# Patient Record
Sex: Male | Born: 1980
Health system: Southern US, Community
[De-identification: ages and names within clinical notes are randomized; demographics above are authoritative.]

## PROBLEM LIST (undated history)

## (undated) DIAGNOSIS — G43909 Migraine, unspecified, not intractable, without status migrainosus: Secondary | ICD-10-CM

## (undated) HISTORY — DX: Migraine, unspecified, not intractable, without status migrainosus: G43.909

---

## 2019-10-11 ENCOUNTER — Emergency Department (HOSPITAL_COMMUNITY)
Admission: EM | Admit: 2019-10-11 | Discharge: 2019-10-11 | Disposition: A | Discharge status: Home / Self Care | Payer: Worker's Compensation | Attending: Emergency Medicine | Admitting: Emergency Medicine

## 2019-10-11 ENCOUNTER — Emergency Department (HOSPITAL_COMMUNITY): Payer: Worker's Compensation

## 2019-10-11 ENCOUNTER — Other Ambulatory Visit: Payer: Self-pay

## 2019-10-11 ENCOUNTER — Encounter (HOSPITAL_COMMUNITY): Payer: Self-pay | Admitting: Emergency Medicine

## 2019-10-11 DIAGNOSIS — Y999 Unspecified external cause status: Secondary | ICD-10-CM | POA: Insufficient documentation

## 2019-10-11 DIAGNOSIS — S81812A Laceration without foreign body, left lower leg, initial encounter: Secondary | ICD-10-CM | POA: Diagnosis not present

## 2019-10-11 DIAGNOSIS — Y929 Unspecified place or not applicable: Secondary | ICD-10-CM | POA: Diagnosis not present

## 2019-10-11 DIAGNOSIS — Z23 Encounter for immunization: Secondary | ICD-10-CM | POA: Diagnosis not present

## 2019-10-11 DIAGNOSIS — W293XXA Contact with powered garden and outdoor hand tools and machinery, initial encounter: Secondary | ICD-10-CM | POA: Diagnosis not present

## 2019-10-11 DIAGNOSIS — Y93H2 Activity, gardening and landscaping: Secondary | ICD-10-CM | POA: Insufficient documentation

## 2019-10-11 MED ORDER — TETANUS-DIPHTH-ACELL PERTUSSIS 5-2.5-18.5 LF-MCG/0.5 IM SUSP
0.5000 mL | Freq: Once | INTRAMUSCULAR | Status: AC
  Administered 2019-10-11: 0.5 mL via INTRAMUSCULAR
  Filled 2019-10-11: qty 0.5

## 2019-10-11 MED ORDER — LIDOCAINE-EPINEPHRINE (PF) 2 %-1:200000 IJ SOLN
20.0000 mL | Freq: Once | INTRAMUSCULAR | Status: AC
  Administered 2019-10-11: 20 mL
  Filled 2019-10-11: qty 20

## 2019-10-11 MED ORDER — SULFAMETHOXAZOLE-TRIMETHOPRIM 800-160 MG PO TABS: 1.0000 | ORAL_TABLET | Freq: Two times a day (BID) | ORAL | 0 refills | Status: AC

## 2019-10-11 NOTE — ED Notes (Signed)
Pt dc'd home w/all belongings, a/o x4, driven home by spouse  

## 2019-10-11 NOTE — ED Triage Notes (Signed)
Pt arrives to ED from work with complaints of accidentally cutting above his left knee with a chainsaw. Patient has about a 6cm laceration but bleeding is currently controlled. Patient has feeling above and below injury.

## 2019-10-11 NOTE — Progress Notes (Signed)
Orthopedic Tech Progress Note Patient Details:  Jauan Wohl 26-Nov-1980 [id number]  Ortho Devices Type of Ortho Device: Knee Immobilizer Ortho Device/Splint Location: LLE Ortho Device/Splint Interventions: Ordered, Application, Adjustment   Post Interventions Patient Tolerated: Well, Ambulated well Instructions Provided: Care of device, Adjustment of device, Poper ambulation with device   Donald Pore 10/11/2019, 4:05 PM

## 2019-10-11 NOTE — Discharge Instructions (Signed)
1. Medications: Tylenol or ibuprofen for pain, antibiotics 2. Treatment: ice for swelling, keep wound clean with warm soap and water and keep bandage dry. Wear knee immobilizer when walking to prevent tearing the stitches  3. Follow Up: Please return in 10 days to have your stitches removed or sooner if you have concerns. Return to the emergency department for increased redness, drainage of pus from the wound   WOUND CARE  Remove bandage and wash wound gently with mild soap and warm water. Reapply a new bandage after cleaning wound   Continue daily cleansing with soap and water until stitches are removed.  Do not apply any ointments or creams to the wound while stitches are in place, as this may cause delayed healing. Return if you experience any of the following signs of infection: Swelling, redness, pus drainage, streaking, fever >101.0 F  Return if you experience excessive bleeding that does not stop after 15-20 minutes of constant, firm pressure.

## 2019-10-11 NOTE — ED Provider Notes (Addendum)
[facility] EMERGENCY DEPARTMENT Provider Note   CSN: [id number] Arrival date & time: 10/11/19  1239     History Chief Complaint  Patient presents with  . Extremity Laceration    Jerome Young is a 39 y.o. male presenting for evaluation of leg laceration.   Patient states his prior to arrival he was cutting grass with a chainsaw when it bounced back and hit his left leg.  He reports acute onset bleeding, but no significant pain.  He has been able to ambulate on it without difficulty.  Denies numbness, tingling, weakness.  He is not on blood thinners.  He is not on his last tetanus shot was, maybe about 5 years ago.  He has no other medical problems, takes medications daily.  He denies injury elsewhere.  HPI     History reviewed. No pertinent past medical history.  There are no problems to display for this patient.    History reviewed. No pertinent family history.  Social History   Tobacco Use  . Smoking status: Not on file  Substance Use Topics  . Alcohol use: Not on file  . Drug use: Not on file    Home Medications Prior to Admission medications   Medication Sig Start Date End Date Taking? Authorizing Provider  sulfamethoxazole-trimethoprim (BACTRIM DS) 800-160 MG tablet Take 1 tablet by mouth 2 (two) times daily for 7 days. 10/11/19 10/18/19  Zabdi Mis, PA-C    Allergies    Penicillins  Review of Systems   Review of Systems  Skin: Positive for wound.  Hematological: Does not bruise/bleed easily.  All other systems reviewed and are negative.   Physical Exam Updated Vital Signs BP (!) 131/93 (BP Location: Right Arm)   Pulse 89   Temp 98.6 F (37 C) (Oral)   Resp 16   Wt 95.3 kg   SpO2 98%   Physical Exam Vitals and nursing note reviewed.  Constitutional:      General: He is not in acute distress.    Appearance: He is well-developed.     Comments: Sitting comfortably in the bed in no acute distress  HENT:     Head:  Normocephalic and atraumatic.  Cardiovascular:     Rate and Rhythm: Normal rate.  Pulmonary:     Effort: Respiratory distress present.  Abdominal:     General: There is no distension.  Musculoskeletal:     Cervical back: Normal range of motion and neck supple.     Comments: Large, approximately 6 cm laceration just proximal to the knee on the anterior thigh. ~ 1.5 cm deep. No visible muscle involvement.  No difficulty with straight leg raise.  Patient able to flex and extend at the knee against resistance without difficulty.  Pedal pulses 2+ bilaterally.  Good distal cap refill.  Skin:    General: Skin is warm and dry.     Capillary Refill: Capillary refill takes less than 2 seconds.  Neurological:     Mental Status: He is alert and oriented to person, place, and time.     ED Results / Procedures / Treatments   Labs (all labs ordered are listed, but only abnormal results are displayed) Labs Reviewed - No data to display  EKG None  Radiology DG Knee Complete 4 Views Left  Result Date: 10/11/2019 CLINICAL DATA:  Chainsaw laceration anterior left femur EXAM: LEFT KNEE - COMPLETE 4+ VIEW COMPARISON:  None. FINDINGS: Frontal, bilateral oblique, lateral views of the left knee demonstrate superficial laceration  suprapatellar region, best seen on lateral view. No fractures or radiopaque foreign bodies. Joint spaces are well preserved. No joint effusion. IMPRESSION: 1. Laceration left suprapatellar region. No fracture or radiopaque foreign body. Electronically Signed   By: Sharlet Salina M.D.   On: 10/11/2019 14:19    Procedures .Marland KitchenLaceration Repair  Date/Time: 10/11/2019 3:44 PM Performed by: Alveria Apley, PA-C Authorized by: Alveria Apley, PA-C   Consent:    Consent obtained:  Verbal   Consent given by:  Patient   Risks discussed:  Infection, nerve damage, need for additional repair, pain, poor cosmetic result, poor wound healing, vascular damage, tendon damage and retained  foreign body Anesthesia (see MAR for exact dosages):    Anesthesia method:  Local infiltration   Local anesthetic:  Lidocaine 2% WITH epi Laceration details:    Location:  Leg   Leg location:  L upper leg   Length (cm):  6   Depth (mm):  15 Repair type:    Repair type:  Intermediate Pre-procedure details:    Preparation:  Patient was prepped and draped in usual sterile fashion and imaging obtained to evaluate for foreign bodies Exploration:    Hemostasis achieved with:  Direct pressure   Wound exploration: wound explored through full range of motion and entire depth of wound probed and visualized     Wound extent: no muscle damage noted, no nerve damage noted, no tendon damage noted, no underlying fracture noted and no vascular damage noted     Contaminated: yes   Treatment:    Area cleansed with:  Saline   Irrigation solution:  Sterile saline   Irrigation volume:  3000   Irrigation method:  Syringe   Visualized foreign bodies/material removed: yes   Subcutaneous repair:    Suture size:  4-0   Wound subcutaneous closure material used: vicryl rapide.   Suture technique:  Simple interrupted   Number of sutures:  3 Skin repair:    Repair method:  Sutures   Suture size:  4-0   Suture material:  Prolene   Suture technique:  Horizontal mattress and simple interrupted   Number of sutures: 7 horizonal mattress, 1 simple interruptec. Approximation:    Approximation:  Close Post-procedure details:    Dressing:  Sterile dressing and splint for protection   Patient tolerance of procedure:  Tolerated well, no immediate complications   (including critical care time)  Medications Ordered in ED Medications  Tdap (BOOSTRIX) injection 0.5 mL (0.5 mLs Intramuscular Given 10/11/19 1424)  lidocaine-EPINEPHrine (XYLOCAINE W/EPI) 2 %-1:200000 (PF) injection 20 mL (20 mLs Infiltration Given by Other 10/11/19 1424)    ED Course  I have reviewed the triage vital signs and the nursing  notes.  Pertinent labs & imaging results that were available during my care of the patient were reviewed by me and considered in my medical decision making (see chart for details).    MDM Rules/Calculators/A&P                      Patient presenting for evaluation of lip laceration.  Physical examination, he was neurovascularly intact.  Full active range of motion without difficulty.  Wound is extremely dirty, with bits of debris.  Irrigated extensively.  X-ray viewed interpreted by me, no fracture or bony injury.  Laceration repaired as described above.  Will place patient on antibiotics due to wound being contaminated.  Will place patient in knee immobilizer for protection until stitches are removed.  Discussed aftercare instructions.  At this time, patient appears safe for d/c. Return precautions given. Pt states he understands and agrees to plan.   Final Clinical Impression(s) / ED Diagnoses Final diagnoses:  Laceration of left lower extremity, initial encounter    Rx / DC Orders ED Discharge Orders         Ordered    sulfamethoxazole-trimethoprim (BACTRIM DS) 800-160 MG tablet  2 times daily     10/11/19 1530           [place], [place] Noori, PA-C 10/11/19 1547    Tegeler, Gwenyth Allegra, MD 10/11/19 1631    Franchot Heidelberg, PA-C 10/11/19 1653    Tegeler, Gwenyth Allegra, MD 10/11/19 1654

## 2019-10-22 ENCOUNTER — Other Ambulatory Visit: Payer: Self-pay

## 2019-10-22 ENCOUNTER — Ambulatory Visit (HOSPITAL_COMMUNITY)
Admission: EM | Admit: 2019-10-22 | Discharge: 2019-10-22 | Disposition: A | Discharge status: Home / Self Care | Payer: 59 | Attending: Family Medicine | Admitting: Family Medicine

## 2019-10-22 DIAGNOSIS — Z4802 Encounter for removal of sutures: Secondary | ICD-10-CM

## 2019-10-22 DIAGNOSIS — Z5189 Encounter for other specified aftercare: Secondary | ICD-10-CM

## 2019-10-22 NOTE — ED Notes (Signed)
5 horizontal mattress sutures and 1 simple stitch removed.  Dr. Tracie Harrier assisted on one suture and he assessed the wound.  Pt tolerated it well.

## 2019-10-24 NOTE — ED Provider Notes (Signed)
  [facility]   [id number] 10/22/19 Arrival Time: 1152  ASSESSMENT & PLAN:  1. Visit for wound check   2. Visit for suture removal     Assisted RN with suture removal. No complications. Wound is healing.  Reviewed expectations re: course of current medical issues. Questions answered. Outlined signs and symptoms indicating need for more acute intervention. Patient verbalized understanding. After Visit Summary given.   Jerome Young is a 39 y.o. male who presents with a laceration of his LLE. Here for suture removal. Reports no complications. Wound of LLE without signs of infection.    Allergies  Allergen Reactions  . Penicillins      Social History   Socioeconomic History  . Marital status: Married    Spouse name: Not on file  . Number of children: Not on file  . Years of education: Not on file  . Highest education level: Not on file  Occupational History  . Not on file  Tobacco Use  . Smoking status: Not on file  Substance and Sexual Activity  . Alcohol use: Not on file  . Drug use: Not on file  . Sexual activity: Not on file  Other Topics Concern  . Not on file  Social History Narrative  . Not on file   Social Determinants of Health   Financial Resource Strain:   . Difficulty of Paying Living Expenses: Not on file  Food Insecurity:   . Worried About Programme researcher, broadcasting/film/video in the Last Year: Not on file  . Ran Out of Food in the Last Year: Not on file  Transportation Needs:   . Lack of Transportation (Medical): Not on file  . Lack of Transportation (Non-Medical): Not on file  Physical Activity:   . Days of Exercise per Week: Not on file  . Minutes of Exercise per Session: Not on file  Stress:   . Feeling of Stress : Not on file  Social Connections:   . Frequency of Communication with Friends and Family: Not on file  . Frequency of Social Gatherings with Friends and Family: Not on file  . Attends Religious Services: Not on file  . Active  Member of Clubs or Organizations: Not on file  . Attends Banker Meetings: Not on file  . Marital Status: Not on file         Mardella Layman, MD 10/24/19 [phone]

## 2020-05-29 ENCOUNTER — Other Ambulatory Visit: Payer: Self-pay

## 2020-05-29 ENCOUNTER — Ambulatory Visit (INDEPENDENT_AMBULATORY_CARE_PROVIDER_SITE_OTHER): Payer: 59 | Admitting: Family Medicine

## 2020-05-29 ENCOUNTER — Encounter: Payer: Self-pay | Admitting: Family Medicine

## 2020-05-29 VITALS — BP 137/90 | HR 116 | Temp 98.5°F | Ht 71.0 in | Wt 210.6 lb

## 2020-05-29 DIAGNOSIS — R030 Elevated blood-pressure reading, without diagnosis of hypertension: Secondary | ICD-10-CM

## 2020-05-29 DIAGNOSIS — R000 Tachycardia, unspecified: Secondary | ICD-10-CM

## 2020-05-29 DIAGNOSIS — Z0000 Encounter for general adult medical examination without abnormal findings: Secondary | ICD-10-CM

## 2020-05-29 DIAGNOSIS — Z0001 Encounter for general adult medical examination with abnormal findings: Secondary | ICD-10-CM | POA: Diagnosis not present

## 2020-05-29 NOTE — Patient Instructions (Signed)
Monitor blood pressure and heart rate at home. Keep a log and bring it back with you. Normal heart rate 60-100. Normal BP < 140/90.   DASH Eating Plan DASH stands for "Dietary Approaches to Stop Hypertension." The DASH eating plan is a healthy eating plan that has been shown to reduce high blood pressure (hypertension). It may also reduce your risk for type 2 diabetes, heart disease, and stroke. The DASH eating plan may also help with weight loss. What are tips for following this plan?  General guidelines  Avoid eating more than 2,300 mg (milligrams) of salt (sodium) a day. If you have hypertension, you may need to reduce your sodium intake to 1,500 mg a day.  Limit alcohol intake to no more than 1 drink a day for nonpregnant women and 2 drinks a day for men. One drink equals 12 oz of beer, 5 oz of wine, or 1 oz of hard liquor.  Work with your health care provider to maintain a healthy body weight or to lose weight. Ask what an ideal weight is for you.  Get at least 30 minutes of exercise that causes your heart to beat faster (aerobic exercise) most days of the week. Activities may include walking, swimming, or biking.  Work with your health care provider or diet and nutrition specialist (dietitian) to adjust your eating plan to your individual calorie needs. Reading food labels   Check food labels for the amount of sodium per serving. Choose foods with less than 5 percent of the Daily Value of sodium. Generally, foods with less than 300 mg of sodium per serving fit into this eating plan.  To find whole grains, look for the word "whole" as the first word in the ingredient list. Shopping  Buy products labeled as "low-sodium" or "no salt added."  Buy fresh foods. Avoid canned foods and premade or frozen meals. Cooking  Avoid adding salt when cooking. Use salt-free seasonings or herbs instead of table salt or sea salt. Check with your health care provider or pharmacist before using  salt substitutes.  Do not fry foods. Cook foods using healthy methods such as baking, boiling, grilling, and broiling instead.  Cook with heart-healthy oils, such as olive, canola, soybean, or sunflower oil. Meal planning  Eat a balanced diet that includes: ? 5 or more servings of fruits and vegetables each day. At each meal, try to fill half of your plate with fruits and vegetables. ? Up to 6-8 servings of whole grains each day. ? Less than 6 oz of lean meat, poultry, or fish each day. A 3-oz serving of meat is about the same size as a deck of cards. One egg equals 1 oz. ? 2 servings of low-fat dairy each day. ? A serving of nuts, seeds, or beans 5 times each week. ? Heart-healthy fats. Healthy fats called Omega-3 fatty acids are found in foods such as flaxseeds and coldwater fish, like sardines, salmon, and mackerel.  Limit how much you eat of the following: ? Canned or prepackaged foods. ? Food that is high in trans fat, such as fried foods. ? Food that is high in saturated fat, such as fatty meat. ? Sweets, desserts, sugary drinks, and other foods with added sugar. ? Full-fat dairy products.  Do not salt foods before eating.  Try to eat at least 2 vegetarian meals each week.  Eat more home-cooked food and less restaurant, buffet, and fast food.  When eating at a restaurant, ask that your food be  prepared with less salt or no salt, if possible. What foods are recommended? The items listed may not be a complete list. Talk with your dietitian about what dietary choices are best for you. Grains Whole-grain or whole-wheat bread. Whole-grain or whole-wheat pasta. Brown rice. [place]row. Bulgur. Whole-grain and low-sodium cereals. Pita bread. Low-fat, low-sodium crackers. Whole-wheat flour tortillas. Vegetables Fresh or frozen vegetables (raw, steamed, roasted, or grilled). Low-sodium or reduced-sodium tomato and vegetable juice. Low-sodium or reduced-sodium tomato sauce and  tomato paste. Low-sodium or reduced-sodium canned vegetables. Fruits All fresh, dried, or frozen fruit. Canned fruit in natural juice (without added sugar). Meat and other protein foods Skinless chicken or [place]. Ground chicken or [place]. Pork with fat trimmed off. Fish and seafood. Egg whites. Dried beans, peas, or lentils. Unsalted nuts, nut butters, and seeds. Unsalted canned beans. Lean cuts of beef with fat trimmed off. Low-sodium, lean deli meat. Dairy Low-fat (1%) or fat-free (skim) milk. Fat-free, low-fat, or reduced-fat cheeses. Nonfat, low-sodium ricotta or cottage cheese. Low-fat or nonfat yogurt. Low-fat, low-sodium cheese. Fats and oils Soft margarine without trans fats. Vegetable oil. Low-fat, reduced-fat, or light mayonnaise and salad dressings (reduced-sodium). Canola, safflower, olive, soybean, and sunflower oils. Avocado. Seasoning and other foods Herbs. Spices. Seasoning mixes without salt. Unsalted popcorn and pretzels. Fat-free sweets. What foods are not recommended? The items listed may not be a complete list. Talk with your dietitian about what dietary choices are best for you. Grains Baked goods made with fat, such as croissants, muffins, or some breads. Dry pasta or rice meal packs. Vegetables Creamed or fried vegetables. Vegetables in a cheese sauce. Regular canned vegetables (not low-sodium or reduced-sodium). Regular canned tomato sauce and paste (not low-sodium or reduced-sodium). Regular tomato and vegetable juice (not low-sodium or reduced-sodium). Angie Fava. Olives. Fruits Canned fruit in a light or heavy syrup. Fried fruit. Fruit in cream or butter sauce. Meat and other protein foods Fatty cuts of meat. Ribs. Fried meat. Berniece Salines. Sausage. Bologna and other processed lunch meats. Salami. Fatback. Hotdogs. Bratwurst. Salted nuts and seeds. Canned beans with added salt. Canned or smoked fish. Whole eggs or egg yolks. Chicken or [place] with skin. Dairy Whole or 2%  milk, cream, and half-and-half. Whole or full-fat cream cheese. Whole-fat or sweetened yogurt. Full-fat cheese. Nondairy creamers. Whipped toppings. Processed cheese and cheese spreads. Fats and oils Butter. Stick margarine. Lard. Shortening. Ghee. Bacon fat. Tropical oils, such as coconut, palm kernel, or palm oil. Seasoning and other foods Salted popcorn and pretzels. Onion salt, garlic salt, seasoned salt, table salt, and sea salt. Worcestershire sauce. Tartar sauce. Barbecue sauce. Teriyaki sauce. Soy sauce, including reduced-sodium. Steak sauce. Canned and packaged gravies. Fish sauce. Oyster sauce. Cocktail sauce. Horseradish that you find on the shelf. Ketchup. Mustard. Meat flavorings and tenderizers. Bouillon cubes. Hot sauce and Tabasco sauce. Premade or packaged marinades. Premade or packaged taco seasonings. Relishes. Regular salad dressings. Where to find more information:  National Heart, Lung, and [place] [facility]: https://wilson-eaton.com/  American Heart Association: www.heart.org Summary  The DASH eating plan is a healthy eating plan that has been shown to reduce high blood pressure (hypertension). It may also reduce your risk for type 2 diabetes, heart disease, and stroke.  With the DASH eating plan, you should limit salt (sodium) intake to 2,300 mg a day. If you have hypertension, you may need to reduce your sodium intake to 1,500 mg a day.  When on the DASH eating plan, aim to eat more fresh fruits and vegetables, whole grains,  lean proteins, low-fat dairy, and heart-healthy fats.  Work with your health care provider or diet and nutrition specialist (dietitian) to adjust your eating plan to your individual calorie needs. This information is not intended to replace advice given to you by your health care provider. Make sure you discuss any questions you have with your health care provider. Document Revised: 08/05/2017 Document Reviewed: 08/16/2016 Elsevier Patient Education  2020  [place] 14-35 Years Old, Male Preventive care refers to lifestyle choices and visits with your health care provider that can promote health and wellness. This includes:  A yearly physical exam. This is also called an annual well check.  Regular dental and eye exams.  Immunizations.  Screening for certain conditions.  Healthy lifestyle choices, such as eating a healthy diet, getting regular exercise, not using drugs or products that contain nicotine and tobacco, and limiting alcohol use. What can I expect for my preventive care visit? Physical exam Your health care provider will check:  Height and weight. These may be used to calculate body mass index (BMI), which is a measurement that tells if you are at a healthy weight.  Heart rate and blood pressure.  Your skin for abnormal spots. Counseling Your health care provider may ask you questions about:  Alcohol, tobacco, and drug use.  Emotional well-being.  Home and relationship well-being.  Sexual activity.  Eating habits.  Work and work Statistician. What immunizations do I need?  Influenza (flu) vaccine  This is recommended every year. Tetanus, diphtheria, and pertussis (Tdap) vaccine  You may need a Td booster every 10 years. Varicella (chickenpox) vaccine  You may need this vaccine if you have not already been vaccinated. Human papillomavirus (HPV) vaccine  If recommended by your health care provider, you may need three doses over 6 months. Measles, mumps, and rubella (MMR) vaccine  You may need at least one dose of MMR. You may also need a second dose. Meningococcal conjugate (MenACWY) vaccine  One dose is recommended if you are 62-89 years old and a Market researcher living in a residence hall, or if you have one of several medical conditions. You may also need additional booster doses. Pneumococcal conjugate (PCV13) vaccine  You may need this if you have certain  conditions and were not previously vaccinated. Pneumococcal polysaccharide (PPSV23) vaccine  You may need one or two doses if you smoke cigarettes or if you have certain conditions. Hepatitis A vaccine  You may need this if you have certain conditions or if you travel or work in places where you may be exposed to hepatitis A. Hepatitis B vaccine  You may need this if you have certain conditions or if you travel or work in places where you may be exposed to hepatitis B. Haemophilus influenzae type b (Hib) vaccine  You may need this if you have certain risk factors. You may receive vaccines as individual doses or as more than one vaccine together in one shot (combination vaccines). Talk with your health care provider about the risks and benefits of combination vaccines. What tests do I need? Blood tests  Lipid and cholesterol levels. These may be checked every 5 years starting at age 86.  Hepatitis C test.  Hepatitis B test. Screening   Diabetes screening. This is done by checking your blood sugar (glucose) after you have not eaten for a while (fasting).  Sexually transmitted disease (STD) testing. Talk with your health care provider about your test results, treatment options, and  if necessary, the need for more tests. Follow these instructions at home: Eating and drinking   Eat a diet that includes fresh fruits and vegetables, whole grains, lean protein, and low-fat dairy products.  Take vitamin and mineral supplements as recommended by your health care provider.  Do not drink alcohol if your health care provider tells you not to drink.  If you drink alcohol: ? Limit how much you have to 0-2 drinks a day. ? Be aware of how much alcohol is in your drink. In the [place] one drink equals one 12 oz bottle of beer (355 mL), one 5 oz glass of wine (148 mL), or one 1 oz glass of hard liquor (44 mL). Lifestyle  Take daily care of your teeth and gums.  Stay active. Exercise for at  least 30 minutes on 5 or more days each week.  Do not use any products that contain nicotine or tobacco, such as cigarettes, e-cigarettes, and chewing tobacco. If you need help quitting, ask your health care provider.  If you are sexually active, practice safe sex. Use a condom or other form of protection to prevent STIs (sexually transmitted infections). What's next?  Go to your health care provider once a year for a well check visit.  Ask your health care provider how often you should have your eyes and teeth checked.  Stay up to date on all vaccines. This information is not intended to replace advice given to you by your health care provider. Make sure you discuss any questions you have with your health care provider. Document Revised: 08/17/2018 Document Reviewed: 08/17/2018 Elsevier Patient Education  2020 [employer].

## 2020-05-29 NOTE — Progress Notes (Signed)
New Patient Office Visit  Assessment & Plan:  1. Well adult exam - Preventive health education provided. Patient declined influenza, COVID-19 vaccines, hepatitis C and HIV screening.  2. Tachycardia - Patient to monitor HR at home and keep a log to bring back with him to his next appointment.  - CBC with Differential/Platelet - CMP14+EGFR - TSH  3. Elevated BP without diagnosis of hypertension - Patient to monitor BP at home and keep a log to bring with him to his next appointment. Education provided on the DASH diet. Exercise encouraged.    Follow-up: Return in about 3 months (around 08/28/2020) for BP/HR.   Hendricks Limes, MSN, APRN, FNP-C [facility]  Subjective:  Patient ID: Jerome Young, male    DOB: 11/14/1980  Age: 39 y.o. MRN: [id number]  Patient Care Team: Patient, No Pcp Per as PCP - General (General Practice)  CC:  Chief Complaint  Patient presents with  . New Patient (Initial Visit)    No previous PCP  . Establish Care  . Irregular Heart Beat    Patient states his wife thinks he has irregular heart beat.    HPI Jerome Young presents to establish care.   Patient reports his wife thinks he has an irregular heart beat and wants it checked out. Patient denies palpitations, chest pain, and skipped beats.    Review of Systems  Constitutional: Negative for chills, fever, malaise/fatigue and weight loss.  HENT: Negative for congestion, ear discharge, ear pain, nosebleeds, sinus pain, sore throat and tinnitus.   Eyes: Negative for blurred vision, double vision, pain, discharge and redness.  Respiratory: Negative for cough, shortness of breath and wheezing.   Cardiovascular: Negative for chest pain, palpitations and leg swelling.  Gastrointestinal: Negative for abdominal pain, constipation, diarrhea, heartburn, nausea and vomiting.  Genitourinary: Negative for dysuria, frequency and urgency.  Musculoskeletal: Negative for myalgias.    Skin: Negative for rash.  Neurological: Negative for dizziness, seizures, weakness and headaches.  Psychiatric/Behavioral: Negative for depression, substance abuse and suicidal ideas. The patient is not nervous/anxious.    No current outpatient medications on file.  Allergies  Allergen Reactions  . Penicillins     Past Medical History:  Diagnosis Date  . Migraines     History reviewed. No pertinent surgical history.  Family History  Problem Relation Age of Onset  . Anxiety disorder Mother   . Depression Mother   . Anxiety disorder Sister   . Depression Sister   . Hypertension Maternal Grandmother   . Hyperlipidemia Maternal Grandmother   . Hypertension Maternal Grandfather     Social History   Socioeconomic History  . Marital status: Married    Spouse name: Not on file  . Number of children: Not on file  . Years of education: Not on file  . Highest education level: Not on file  Occupational History  . Not on file  Tobacco Use  . Smoking status: Never Smoker  . Smokeless tobacco: Never Used  Vaping Use  . Vaping Use: Never used  Substance and Sexual Activity  . Alcohol use: Not Currently  . Drug use: Never  . Sexual activity: Yes    Birth control/protection: Surgical  Other Topics Concern  . Not on file  Social History Narrative  . Not on file   Social Determinants of Health   Financial Resource Strain:   . Difficulty of Paying Living Expenses: Not on file  Food Insecurity:   . Worried About Charity fundraiser  in the Last Year: Not on file  . Ran Out of Food in the Last Year: Not on file  Transportation Needs:   . Lack of Transportation (Medical): Not on file  . Lack of Transportation (Non-Medical): Not on file  Physical Activity:   . Days of Exercise per Week: Not on file  . Minutes of Exercise per Session: Not on file  Stress:   . Feeling of Stress : Not on file  Social Connections:   . Frequency of Communication with Friends and Family: Not  on file  . Frequency of Social Gatherings with Friends and Family: Not on file  . Attends Religious Services: Not on file  . Active Member of Clubs or Organizations: Not on file  . Attends Archivist Meetings: Not on file  . Marital Status: Not on file  Intimate Partner Violence:   . Fear of Current or Ex-Partner: Not on file  . Emotionally Abused: Not on file  . Physically Abused: Not on file  . Sexually Abused: Not on file    Objective:   Today's Vitals: BP 137/90   Pulse (!) 116   Temp 98.5 F (36.9 C) (Temporal)   Ht 5' 11"  (1.803 m)   Wt 210 lb 9.6 oz (95.5 kg)   SpO2 98%   BMI 29.37 kg/m   Physical Exam Vitals reviewed.  Constitutional:      General: He is not in acute distress.    Appearance: Normal appearance. He is overweight. He is not ill-appearing, toxic-appearing or diaphoretic.  HENT:     Head: Normocephalic and atraumatic.     Right Ear: Tympanic membrane, ear canal and external ear normal. There is no impacted cerumen.     Left Ear: Tympanic membrane, ear canal and external ear normal. There is no impacted cerumen.     Nose: Nose normal. No congestion or rhinorrhea.     Mouth/Throat:     Mouth: Mucous membranes are moist.     Pharynx: Oropharynx is clear. No oropharyngeal exudate or posterior oropharyngeal erythema.  Eyes:     General: No scleral icterus.       Right eye: No discharge.        Left eye: No discharge.     Conjunctiva/sclera: Conjunctivae normal.     Pupils: Pupils are equal, round, and reactive to light.  Cardiovascular:     Rate and Rhythm: Normal rate and regular rhythm.     Heart sounds: Normal heart sounds. No murmur heard.  No friction rub. No gallop.   Pulmonary:     Effort: Pulmonary effort is normal. No respiratory distress.     Breath sounds: Normal breath sounds. No stridor. No wheezing, rhonchi or rales.  Abdominal:     General: Abdomen is flat. Bowel sounds are normal. There is no distension.     Palpations:  Abdomen is soft. There is no mass.     Tenderness: There is no abdominal tenderness. There is no guarding or rebound.     Hernia: No hernia is present.  Musculoskeletal:        General: Normal range of motion.     Cervical back: Normal range of motion and neck supple. No rigidity. No muscular tenderness.     Right lower leg: No edema.     Left lower leg: No edema.  Lymphadenopathy:     Cervical: No cervical adenopathy.  Skin:    General: Skin is warm and dry.     Capillary Refill: Capillary  refill takes less than 2 seconds.  Neurological:     General: No focal deficit present.     Mental Status: He is alert and oriented to person, place, and time. Mental status is at baseline.  Psychiatric:        Mood and Affect: Mood normal.        Behavior: Behavior normal.        Thought Content: Thought content normal.        Judgment: Judgment normal.

## 2020-05-30 LAB — CMP14+EGFR
ALT: 23 IU/L (ref 0–44)
AST: 18 IU/L (ref 0–40)
Albumin/Globulin Ratio: 1.7 (ref 1.2–2.2)
Albumin: 4.8 g/dL (ref 4.0–5.0)
Alkaline Phosphatase: 100 IU/L (ref 44–121)
BUN/Creatinine Ratio: 12 (ref 9–20)
BUN: 12 mg/dL (ref 6–20)
Bilirubin Total: 0.7 mg/dL (ref 0.0–1.2)
CO2: 23 mmol/L (ref 20–29)
Calcium: 10.1 mg/dL (ref 8.7–10.2)
Chloride: 99 mmol/L (ref 96–106)
Creatinine, Ser: 1.02 mg/dL (ref 0.76–1.27)
GFR calc Af Amer: 107 mL/min/{1.73_m2} (ref >59)
GFR calc non Af Amer: 93 mL/min/{1.73_m2} (ref >59)
Globulin, Total: 2.9 g/dL (ref 1.5–4.5)
Glucose: 77 mg/dL (ref 65–99)
Potassium: 3.9 mmol/L (ref 3.5–5.2)
Sodium: 137 mmol/L (ref 134–144)
Total Protein: 7.7 g/dL (ref 6.0–8.5)

## 2020-05-30 LAB — CBC WITH DIFFERENTIAL/PLATELET
Basophils Absolute: 0 10*3/uL (ref 0.0–0.2)
Basos: 0 %
EOS (ABSOLUTE): 0.1 10*3/uL (ref 0.0–0.4)
Eos: 2 %
Hematocrit: 42.8 % (ref 37.5–51.0)
Hemoglobin: 14.2 g/dL (ref 13.0–17.7)
Immature Grans (Abs): 0 10*3/uL (ref 0.0–0.1)
Immature Granulocytes: 0 %
Lymphocytes Absolute: 1.8 10*3/uL (ref 0.7–3.1)
Lymphs: 25 %
MCH: 28.3 pg (ref 26.6–33.0)
MCHC: 33.2 g/dL (ref 31.5–35.7)
MCV: 85 fL (ref 79–97)
Monocytes Absolute: 0.6 10*3/uL (ref 0.1–0.9)
Monocytes: 8 %
Neutrophils Absolute: 4.7 10*3/uL (ref 1.4–7.0)
Neutrophils: 65 %
Platelets: 293 10*3/uL (ref 150–450)
RBC: 5.01 x10E6/uL (ref 4.14–5.80)
RDW: 13.2 % (ref 11.6–15.4)
WBC: 7.2 10*3/uL (ref 3.4–10.8)

## 2020-05-30 LAB — TSH: TSH: 1.33 u[IU]/mL (ref 0.450–4.500)

## 2020-06-09 ENCOUNTER — Encounter: Payer: Self-pay | Admitting: Family Medicine

## 2020-06-09 DIAGNOSIS — R030 Elevated blood-pressure reading, without diagnosis of hypertension: Secondary | ICD-10-CM | POA: Insufficient documentation

## 2020-06-09 DIAGNOSIS — R000 Tachycardia, unspecified: Secondary | ICD-10-CM | POA: Insufficient documentation

## 2020-09-02 ENCOUNTER — Encounter: Payer: Self-pay | Admitting: Nurse Practitioner

## 2020-09-02 ENCOUNTER — Other Ambulatory Visit: Payer: Self-pay

## 2020-09-02 ENCOUNTER — Ambulatory Visit (INDEPENDENT_AMBULATORY_CARE_PROVIDER_SITE_OTHER): Admitting: Nurse Practitioner

## 2020-09-02 VITALS — BP 131/78 | HR 112 | Temp 98.3°F | Ht 71.0 in | Wt 221.8 lb

## 2020-09-02 DIAGNOSIS — R030 Elevated blood-pressure reading, without diagnosis of hypertension: Secondary | ICD-10-CM | POA: Diagnosis not present

## 2020-09-02 NOTE — Assessment & Plan Note (Signed)
Patient's elevated blood pressure reading is resolved.  Patient reports changing jobs and managing stress which has significantly affected his blood pressure.  No changes necessary, patient was not on any medication.  Advised patient to continue stress management, and follow-up with any new elevated blood pressure readings.

## 2020-09-02 NOTE — Patient Instructions (Signed)

## 2020-09-02 NOTE — Progress Notes (Signed)
Established Patient Office Visit  Subjective:  Patient ID: Jerome Young, male    DOB: 20-Nov-1980  Age: 39 y.o. MRN: [id number]  CC:  Chief Complaint  Patient presents with  . Follow-up    BP    HPI [name] is a 39 year old male who presents to clinic for elevated blood pressure.  Patient was having elevated blood pressure in the last 3 months due to the nature of his job.  Today patient presents to clinic with blood pressure log.  Blood pressure is ranging from 100/65-112/68.  Patient reports changing jobs significantly affected his blood pressure.  Patient also is reporting well-managed stress and has not had any elevated blood pressures in the last 3 months.    Past Medical History:  Diagnosis Date  . Migraines     History reviewed. No pertinent surgical history.  Family History  Problem Relation Age of Onset  . Anxiety disorder Mother   . Depression Mother   . Anxiety disorder Sister   . Depression Sister   . Hypertension Maternal Grandmother   . Hyperlipidemia Maternal Grandmother   . Hypertension Maternal Grandfather     Social History   Socioeconomic History  . Marital status: Married    Spouse name: Not on file  . Number of children: Not on file  . Years of education: Not on file  . Highest education level: Not on file  Occupational History  . Not on file  Tobacco Use  . Smoking status: Never Smoker  . Smokeless tobacco: Never Used  Vaping Use  . Vaping Use: Never used  Substance and Sexual Activity  . Alcohol use: Not Currently  . Drug use: Never  . Sexual activity: Yes    Birth control/protection: Surgical  Other Topics Concern  . Not on file  Social History Narrative  . Not on file   Social Determinants of Health   Financial Resource Strain: Not on file  Food Insecurity: Not on file  Transportation Needs: Not on file  Physical Activity: Not on file  Stress: Not on file  Social Connections: Not on file  Intimate Partner Violence:  Not on file    No outpatient medications prior to visit.   No facility-administered medications prior to visit.    Allergies  Allergen Reactions  . Penicillins     ROS Review of Systems  Neurological: Negative for dizziness, light-headedness, numbness and headaches.  All other systems reviewed and are negative.     Objective:    Physical Exam Vitals reviewed.  Constitutional:      Appearance: Normal appearance.  HENT:     Head: Normocephalic.     Nose: Nose normal.  Eyes:     Conjunctiva/sclera: Conjunctivae normal.  Pulmonary:     Effort: Pulmonary effort is normal.     Breath sounds: Normal breath sounds.  Abdominal:     General: Bowel sounds are normal.  Musculoskeletal:        General: No tenderness.  Skin:    General: Skin is warm.  Neurological:     Mental Status: He is alert and oriented to person, place, and time.  Psychiatric:        Mood and Affect: Mood normal.        Behavior: Behavior normal.     BP 131/78   Pulse (!) 112   Temp 98.3 F (36.8 C)   Ht 5\' 11"  (1.803 m)   Wt 221 lb 12.8 oz (100.6 kg)   SpO2 100%  BMI 30.93 kg/m  Wt Readings from Last 3 Encounters:  09/02/20 221 lb 12.8 oz (100.6 kg)  05/29/20 210 lb 9.6 oz (95.5 kg)  10/11/19 210 lb (95.3 kg)     There are no preventive care reminders to display for this patient.  There are no preventive care reminders to display for this patient.  Lab Results  Component Value Date   TSH 1.330 05/29/2020   Lab Results  Component Value Date   WBC 7.2 05/29/2020   HGB 14.2 05/29/2020   HCT 42.8 05/29/2020   MCV 85 05/29/2020   PLT 293 05/29/2020   Lab Results  Component Value Date   NA 137 05/29/2020   K 3.9 05/29/2020   CO2 23 05/29/2020   GLUCOSE 77 05/29/2020   BUN 12 05/29/2020   CREATININE 1.02 05/29/2020   BILITOT 0.7 05/29/2020   ALKPHOS 100 05/29/2020   AST 18 05/29/2020   ALT 23 05/29/2020   PROT 7.7 05/29/2020   ALBUMIN 4.8 05/29/2020   CALCIUM 10.1  05/29/2020      Assessment & Plan:   Problem List Items Addressed This Visit      Other   Elevated blood pressure reading - Primary    Patient's elevated blood pressure reading is resolved.  Patient reports changing jobs and managing stress which has significantly affected his blood pressure.  No changes necessary, patient was not on any medication.  Advised patient to continue stress management, and follow-up with any new elevated blood pressure readings.           Follow-up: Return if symptoms worsen or fail to improve.    Daryll Drown, NP

## 2020-11-20 IMAGING — DX DG KNEE COMPLETE 4+V*L*
4 series · 4 of 4 positions shown · non-contrast
Comparison: None.

CLINICAL DATA: Chainsaw laceration anterior left femur

EXAM:
LEFT KNEE - COMPLETE 4+ VIEW

[knee ap]
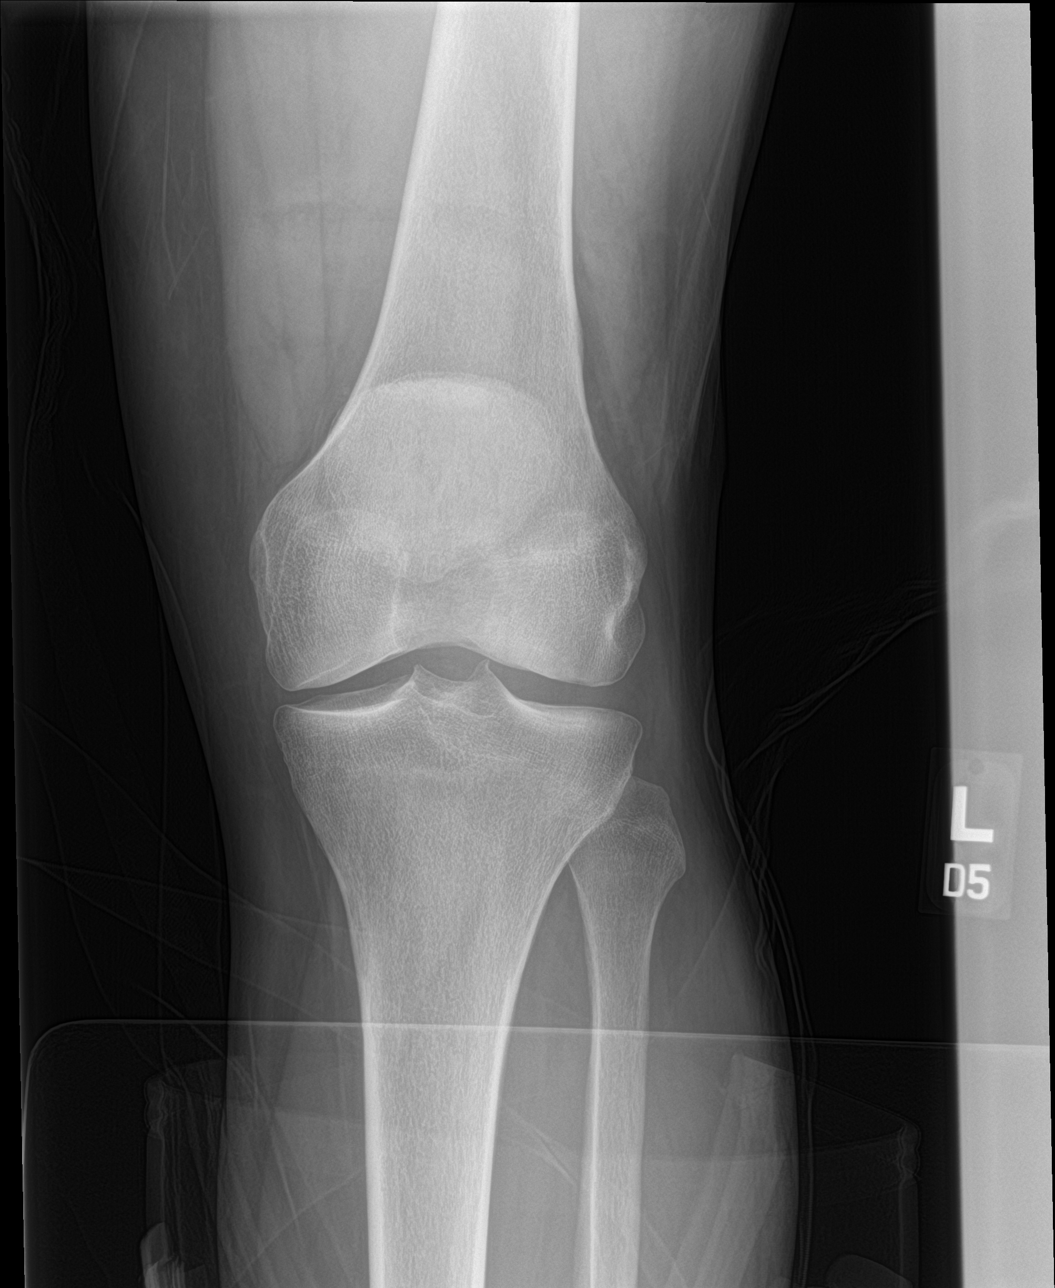

[knee lat]
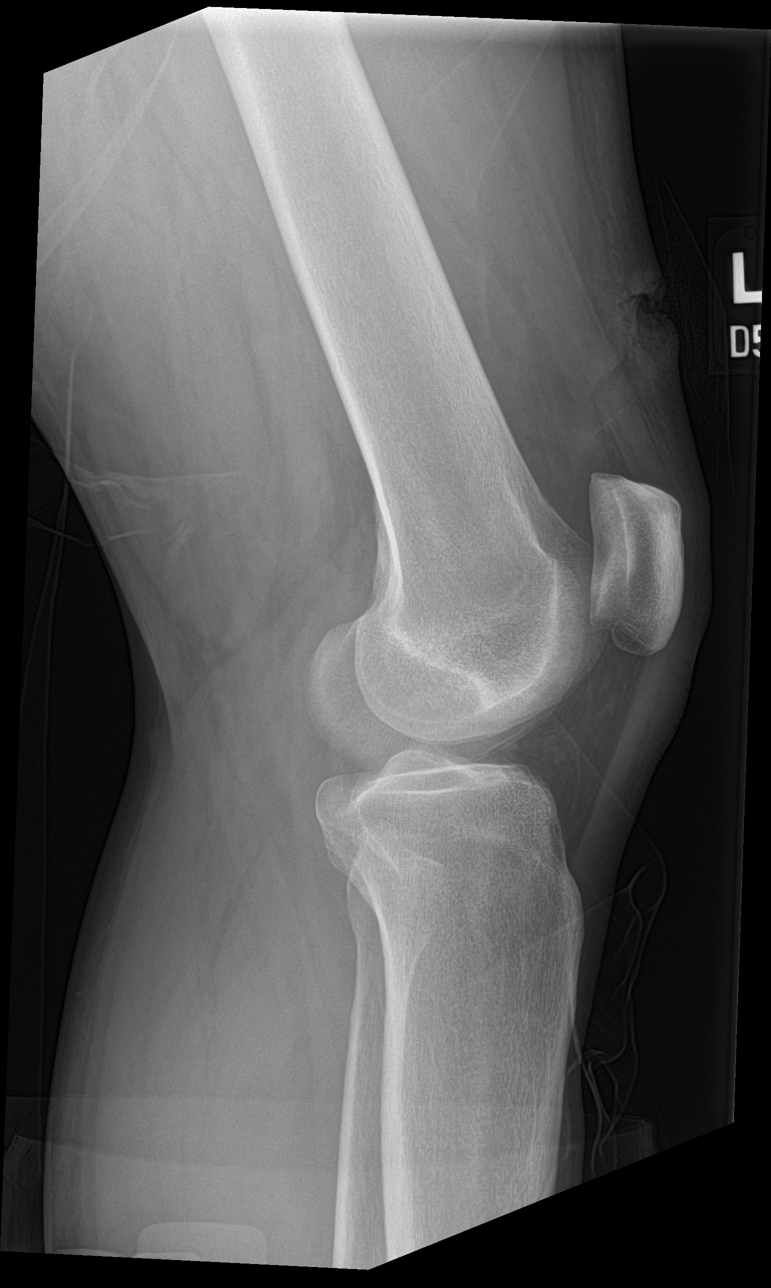

[knee obl (1 of 2)]
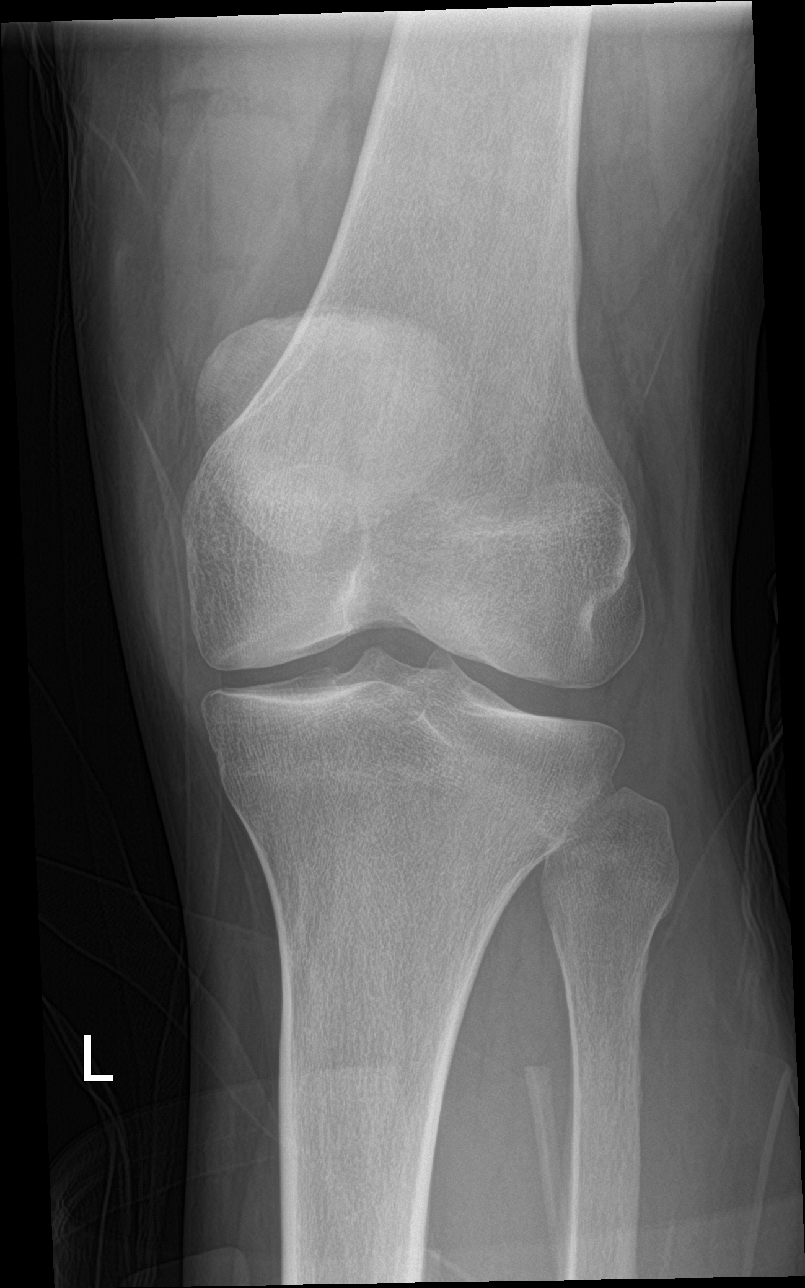

[knee obl (2 of 2)]
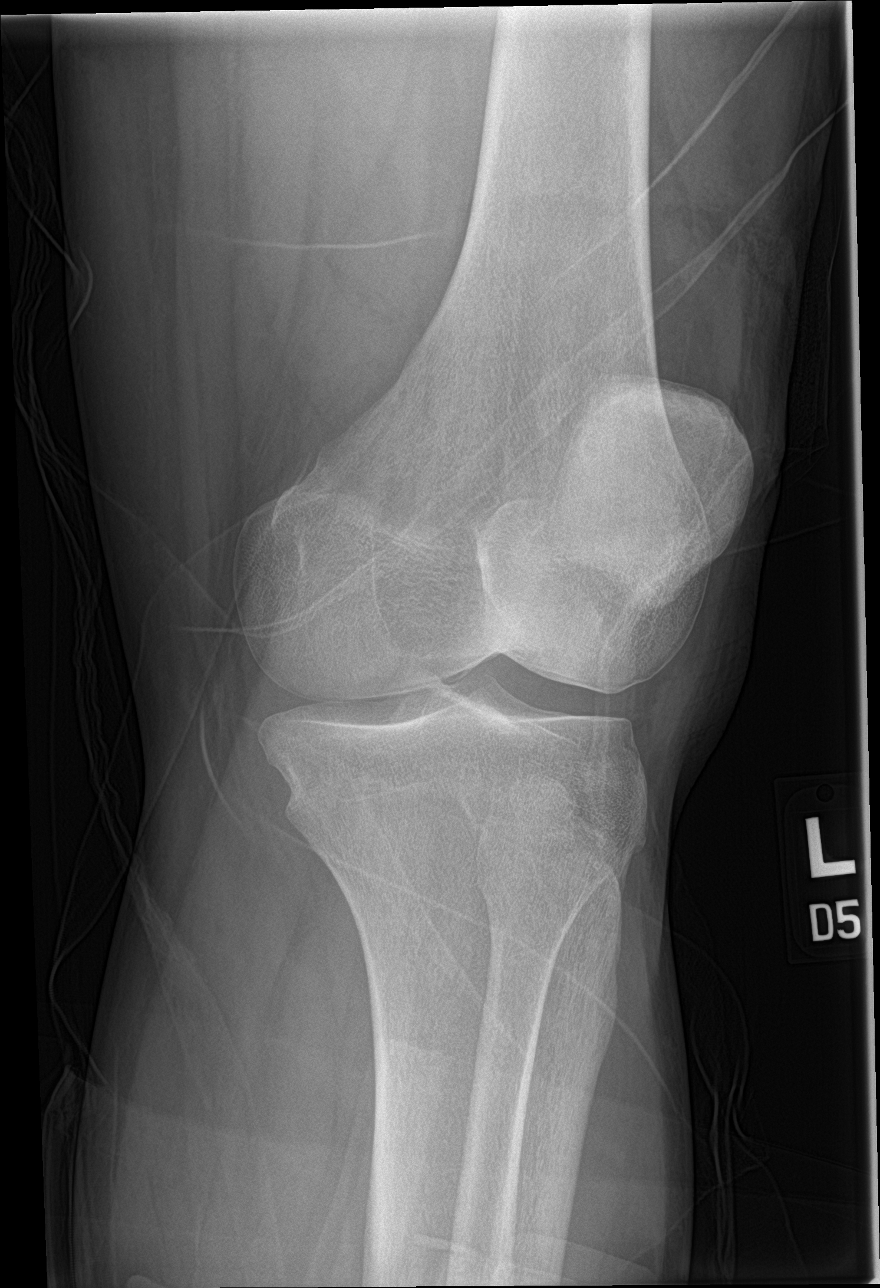

[4 of 4 positions shown; findings below may reference images not displayed]

FINDINGS: Frontal, bilateral oblique, lateral views of the left knee
demonstrate superficial laceration suprapatellar region, best seen
on lateral view. No fractures or radiopaque foreign bodies. Joint
spaces are well preserved. No joint effusion.
IMPRESSION: 1. Laceration left suprapatellar region. No fracture or radiopaque
foreign body.

## 2021-01-28 ENCOUNTER — Telehealth: Payer: Self-pay | Admitting: Nurse Practitioner

## 2021-01-28 ENCOUNTER — Encounter: Payer: Self-pay | Admitting: Nurse Practitioner

## 2021-01-28 ENCOUNTER — Other Ambulatory Visit: Payer: Self-pay

## 2021-01-28 ENCOUNTER — Ambulatory Visit (INDEPENDENT_AMBULATORY_CARE_PROVIDER_SITE_OTHER): Admitting: Nurse Practitioner

## 2021-01-28 VITALS — BP 140/88 | HR 79 | Temp 99.6°F | Ht 71.0 in | Wt 222.0 lb

## 2021-01-28 DIAGNOSIS — M549 Dorsalgia, unspecified: Secondary | ICD-10-CM | POA: Insufficient documentation

## 2021-01-28 DIAGNOSIS — M5489 Other dorsalgia: Secondary | ICD-10-CM

## 2021-01-28 MED ORDER — IBUPROFEN 600 MG PO TABS: 600.0000 mg | ORAL_TABLET | Freq: Three times a day (TID) | ORAL | 0 refills | Status: DC | PRN

## 2021-01-28 MED ORDER — NAPROXEN 500 MG PO TABS: 500.0000 mg | ORAL_TABLET | Freq: Two times a day (BID) | ORAL | 0 refills | Status: DC

## 2021-01-28 MED ORDER — METHOCARBAMOL 500 MG PO TABS: 500.0000 mg | ORAL_TABLET | Freq: Four times a day (QID) | ORAL | 0 refills | Status: DC

## 2021-01-28 NOTE — Patient Instructions (Signed)
Acute Back Pain, Adult Acute back pain is sudden and usually short-lived. It is often caused by an injury to the muscles and tissues in the back. The injury may result from:  A muscle or ligament getting overstretched or torn (strained). Ligaments are tissues that connect bones to each other. Lifting something improperly can cause a back strain.  Wear and tear (degeneration) of the spinal disks. Spinal disks are circular tissue that provide cushioning between the bones of the spine (vertebrae).  Twisting motions, such as while playing sports or doing yard work.  A hit to the back.  Arthritis. You may have a physical exam, lab tests, and imaging tests to find the cause of your pain. Acute back pain usually goes away with rest and home care. Follow these instructions at home: Managing pain, stiffness, and swelling  Treatment may include medicines for pain and inflammation that are taken by mouth or applied to the skin, prescription pain medicine, or muscle relaxants. Take over-the-counter and prescription medicines only as told by your health care provider.  Your health care provider may recommend applying ice during the first 24-48 hours after your pain starts. To do this: ? Put ice in a plastic bag. ? Place a towel between your skin and the bag. ? Leave the ice on for 20 minutes, 2-3 times a day.  If directed, apply heat to the affected area as often as told by your health care provider. Use the heat source that your health care provider recommends, such as a moist heat pack or a heating pad. ? Place a towel between your skin and the heat source. ? Leave the heat on for 20-30 minutes. ? Remove the heat if your skin turns bright red. This is especially important if you are unable to feel pain, heat, or cold. You have a greater risk of getting burned. Activity  Do not stay in bed. Staying in bed for more than 1-2 days can delay your recovery.  Sit up and stand up straight. Avoid leaning  forward when you sit or hunching over when you stand. ? If you work at a desk, sit close to it so you do not need to lean over. Keep your chin tucked in. Keep your neck drawn back, and keep your elbows bent at a 90-degree angle (right angle). ? Sit high and close to the steering wheel when you drive. Add lower back (lumbar) support to your car seat, if needed.  Take short walks on even surfaces as soon as you are able. Try to increase the length of time you walk each day.  Do not sit, drive, or stand in one place for more than 30 minutes at a time. Sitting or standing for long periods of time can put stress on your back.  Do not drive or use heavy machinery while taking prescription pain medicine.  Use proper lifting techniques. When you bend and lift, use positions that put less stress on your back: ? Bend your knees. ? Keep the load close to your body. ? Avoid twisting.  Exercise regularly as told by your health care provider. Exercising helps your back heal faster and helps prevent back injuries by keeping muscles strong and flexible.  Work with a physical therapist to make a safe exercise program, as recommended by your health care provider. Do any exercises as told by your physical therapist.   Lifestyle  Maintain a healthy weight. Extra weight puts stress on your back and makes it difficult to have   good posture.  Avoid activities or situations that make you feel anxious or stressed. Stress and anxiety increase muscle tension and can make back pain worse. Learn ways to manage anxiety and stress, such as through exercise. General instructions  Sleep on a firm mattress in a comfortable position. Try lying on your side with your knees slightly bent. If you lie on your back, put a pillow under your knees.  Follow your treatment plan as told by your health care provider. This may include: ? Cognitive or behavioral therapy. ? Acupuncture or massage therapy. ? Meditation or yoga. Contact  a health care provider if:  You have pain that is not relieved with rest or medicine.  You have increasing pain going down into your legs or buttocks.  Your pain does not improve after 2 weeks.  You have pain at night.  You lose weight without trying.  You have a fever or chills. Get help right away if:  You develop new bowel or bladder control problems.  You have unusual weakness or numbness in your arms or legs.  You develop nausea or vomiting.  You develop abdominal pain.  You feel faint. Summary  Acute back pain is sudden and usually short-lived.  Use proper lifting techniques. When you bend and lift, use positions that put less stress on your back.  Take over-the-counter and prescription medicines and apply heat or ice as directed by your health care provider. This information is not intended to replace advice given to you by your health care provider. Make sure you discuss any questions you have with your health care provider. Document Revised: 05/16/2020 Document Reviewed: 05/16/2020 Elsevier Patient Education  2021 [employer] Inc[employer]  

## 2021-01-29 NOTE — Telephone Encounter (Signed)
Is he able to tolerate other NSAIDs such as Ibuprofen? If not, I recommend Tylenol. If yes, I can send something else in.

## 2021-01-29 NOTE — Telephone Encounter (Signed)
Lmtcb.

## 2021-01-29 NOTE — Telephone Encounter (Signed)
Treating provider - JE Will send to PCP to advise

## 2021-01-29 NOTE — Telephone Encounter (Signed)
What is his allergy/reaction? He doesn't have this listed in his chart.

## 2021-01-29 NOTE — Telephone Encounter (Signed)
Patient states that it gives him rapid heart rate

## 2021-01-31 NOTE — Assessment & Plan Note (Signed)
Unresolved lower back pain. Robaxin 500 mg tablet by mouth 3 times daily, naproxen 500 mg tablet twice daily as needed for pain Back strengthening exercises. Heat pack as tolerated Follow-up with worsening or unresolved symptoms.

## 2021-02-10 NOTE — Telephone Encounter (Signed)
NA °No call back  °This encounter will be closed  °

## 2022-02-09 ENCOUNTER — Ambulatory Visit (INDEPENDENT_AMBULATORY_CARE_PROVIDER_SITE_OTHER): Payer: 59 | Admitting: Nurse Practitioner

## 2022-02-09 ENCOUNTER — Encounter: Payer: Self-pay | Admitting: Nurse Practitioner

## 2022-02-09 VITALS — BP 128/83 | HR 93 | Temp 97.8°F | Ht 72.0 in | Wt 206.4 lb

## 2022-02-09 DIAGNOSIS — W57XXXA Bitten or stung by nonvenomous insect and other nonvenomous arthropods, initial encounter: Secondary | ICD-10-CM | POA: Diagnosis not present

## 2022-02-09 DIAGNOSIS — S30861A Insect bite (nonvenomous) of abdominal wall, initial encounter: Secondary | ICD-10-CM | POA: Diagnosis not present

## 2022-02-09 MED ORDER — DOXYCYCLINE HYCLATE 200 MG PO TBEC: 200.0000 mg | DELAYED_RELEASE_TABLET | Freq: Once | ORAL | 0 refills | Status: AC

## 2022-02-09 NOTE — Patient Instructions (Signed)
Tick Bite Information, Adult  Ticks are insects that can bite. Most ticks live in shrubs and grassy areas. They climb onto people and animals that go by. Then they bite. Some ticks carry germs that can make you sick. How can I prevent tick bites? Take these steps: Use insect repellent Use an insect repellent that has 20% or higher of the ingredients DEET, picaridin, or IR3535. Follow the instructions on the label. Put it on: Bare skin. The tops of your boots. Your pant legs. The ends of your sleeves. If you use an insect repellent that has the ingredient permethrin, follow the instructions on the label. Put it on: Clothing. Boots. Supplies or outdoor gear. Tents. When you are outside Wear long sleeves and long pants. Wear light-colored clothes. Tuck your pant legs into your socks. Stay in the middle of the trail. Do not touch the bushes. Avoid walking through long grass. Check for ticks on your clothes, hair, and skin often while you are outside. Before going inside your house, check your clothes, skin, head, neck, armpits, waist, groin, and joint areas. When you go indoors Check your clothes for ticks. Dry your clothes in a dryer on high heat for 10 minutes or more. If clothes are damp, additional time may be needed. Wash your clothes right away if they need to be washed. Use hot water. Check your pets and outdoor gear. Shower right away. Check your body for ticks. Do a full body check using a mirror. What is the right way to remove a tick? Remove the tick from your skin as soon as possible. Do not remove the tick with your bare fingers. To remove a tick that is crawling on your skin: Go outdoors and brush the tick off. Use tape or a lint roller. To remove a tick that is biting: Wash your hands. If you have latex gloves, put them on. Use tweezers, curved forceps, or a tick-removal tool to grasp the tick. Grasp the tick as close to your skin and as close to the tick's head as  possible. Gently pull up until the tick lets go. Try to keep the tick's head attached to its body. Do not twist or jerk the tick. Do not squeeze or crush the tick. Do not try to remove a tick with heat, alcohol, petroleum jelly, or fingernail polish. What should I do after taking out a tick? Throw away the tick. Do not crush a tick with your fingers. Clean the bite area and your hands with soap and water, rubbing alcohol, or an iodine wash. If an antiseptic cream or ointment is available, apply a small amount to the bite area. Wash and disinfect any instruments that you used to remove the tick. How should I get rid of a live tick? To dispose of a live tick, use one of these methods: Place the tick in rubbing alcohol. Place the tick in a bag or container you can close tightly. Wrap the tick tightly in tape. Flush the tick down the toilet. Contact a doctor if: You have symptoms, such as: A fever or chills. A red rash that makes a circle (bull's-eye rash) in the bite area. Redness and swelling where the tick bit you. Headache. Pain in a muscle, joint, or bone. Being more tired than normal. Trouble walking or moving your legs. Numbness in your legs. Tender and swollen lymph glands. A part of a tick breaks off and gets stuck in your skin. Get help right away if: You cannot remove   a tick. You cannot move (have paralysis) or feel weak. You are feeling worse or have new symptoms. You find a tick that is biting you and filled with blood. This is important if you are in an area where diseases from ticks are common. Summary Ticks may carry germs that can make you sick. To prevent tick bites wear long sleeves, long pants, and light colors. Use insect repellent. Follow the instructions on the label. If the tick is biting, do not try to remove it with heat, alcohol, petroleum jelly, or fingernail polish. Use tweezers, curved forceps, or a tick-removal tool to grasp the tick. Gently pull up  until the tick lets go. Do not twist or jerk the tick. Do not squeeze or crush the tick. If you have symptoms, contact a doctor. This information is not intended to replace advice given to you by your health care provider. Make sure you discuss any questions you have with your health care provider. Document Revised: 08/20/2019 Document Reviewed: 08/20/2019 Elsevier Patient Education  2023 [employer] Inc.  

## 2022-02-09 NOTE — Progress Notes (Signed)
   Acute Office Visit  Subjective:     Patient ID: Jerome Young, male    DOB: 07/05/1981, 41 y.o.   MRN: [id number]  Chief Complaint  Patient presents with   Tick Removal    On Friday , 2 ticks on the same spot above belt line     HPI Patient is in today for tick bite in the last 3 days.  Patient unaware of how long tick had been attached to skin probably less than 48 hours.  Patient developed fever and chills with sweating, body aches with flulike symptoms.  Mild nausea no vomiting.  Denies headache, visual changes and lethargy.  Review of Systems  Constitutional:  Positive for chills and fever.  HENT: Negative.    Respiratory: Negative.    Cardiovascular: Negative.   Gastrointestinal: Negative.   Genitourinary: Negative.   Musculoskeletal: Negative.  Negative for joint pain and myalgias.  Skin:  Positive for rash.  All other systems reviewed and are negative.      Objective:    BP 128/83   Pulse 93   Temp 97.8 F (36.6 C)   Ht 6' (1.829 m)   Wt 206 lb 6.4 oz (93.6 kg)   SpO2 98%   BMI 27.99 kg/m    Physical Exam Vitals and nursing note reviewed.  Constitutional:      Appearance: Normal appearance.  HENT:     Head: Normocephalic.     Nose: Nose normal.  Eyes:     Conjunctiva/sclera: Conjunctivae normal.  Cardiovascular:     Rate and Rhythm: Normal rate and regular rhythm.  Pulmonary:     Effort: Pulmonary effort is normal.     Breath sounds: Normal breath sounds.  Abdominal:     General: Bowel sounds are normal.  Skin:    General: Skin is warm.     Findings: Erythema and rash present.          Comments: Tick bite spot  Neurological:     General: No focal deficit present.     Mental Status: He is alert and oriented to person, place, and time.  Psychiatric:        Mood and Affect: Mood normal.        Behavior: Behavior normal.    No results found for any visits on 02/09/22.      Assessment & Plan:  Irritation from tick bite with mild flulike  symptoms.  Completed assessment.  Prophylaxis given to patient doxycycline 200 mg once.  Increase hydration, Tylenol for fever and pain.  Lyme ABS labs completed in 2 weeks. Follow-up with worsening unresolved symptoms.  Problem List Items Addressed This Visit   None Visit Diagnoses     Tick bite of groin, initial encounter    -  Primary   Relevant Medications   Doxycycline Hyclate 200 MG TBEC   Other Relevant Orders   Lyme Disease Serology w/Reflex       Meds ordered this encounter  Medications   Doxycycline Hyclate 200 MG TBEC    Sig: Take 200 mg by mouth once for 1 dose.    Dispense:  1 tablet    Refill:  0    Order Specific Question:   Supervising Provider    Answer:   Mechele Claude [[id number]]    Return if symptoms worsen or fail to improve.  Daryll Drown, NP

## 2022-02-19 ENCOUNTER — Other Ambulatory Visit: Payer: 59

## 2022-02-19 ENCOUNTER — Other Ambulatory Visit: Payer: Self-pay

## 2022-02-19 DIAGNOSIS — W57XXXA Bitten or stung by nonvenomous insect and other nonvenomous arthropods, initial encounter: Secondary | ICD-10-CM

## 2022-02-23 LAB — LYME DISEASE SEROLOGY W/REFLEX: Lyme Total Antibody EIA: NEGATIVE

## 2022-06-28 ENCOUNTER — Emergency Department (HOSPITAL_BASED_OUTPATIENT_CLINIC_OR_DEPARTMENT_OTHER)

## 2022-06-28 ENCOUNTER — Other Ambulatory Visit: Payer: Self-pay

## 2022-06-28 ENCOUNTER — Encounter (HOSPITAL_BASED_OUTPATIENT_CLINIC_OR_DEPARTMENT_OTHER): Payer: Self-pay

## 2022-06-28 ENCOUNTER — Emergency Department (HOSPITAL_BASED_OUTPATIENT_CLINIC_OR_DEPARTMENT_OTHER)
Admission: EM | Admit: 2022-06-28 | Discharge: 2022-06-29 | Disposition: A | Discharge status: Home / Self Care | Attending: Emergency Medicine | Admitting: Emergency Medicine

## 2022-06-28 DIAGNOSIS — R042 Hemoptysis: Secondary | ICD-10-CM | POA: Diagnosis present

## 2022-06-28 LAB — CBC WITH DIFFERENTIAL/PLATELET
Abs Immature Granulocytes: 0.07 10*3/uL (ref 0.00–0.07)
Basophils Absolute: 0 10*3/uL (ref 0.0–0.1)
Basophils Relative: 0 %
Eosinophils Absolute: 0.4 10*3/uL (ref 0.0–0.5)
Eosinophils Relative: 3 %
HCT: 40.6 % (ref 39.0–52.0)
Hemoglobin: 13.1 g/dL (ref 13.0–17.0)
Immature Granulocytes: 1 %
Lymphocytes Relative: 32 %
Lymphs Abs: 4.5 10*3/uL — ABNORMAL HIGH (ref 0.7–4.0)
MCH: 28.2 pg (ref 26.0–34.0)
MCHC: 32.3 g/dL (ref 30.0–36.0)
MCV: 87.5 fL (ref 80.0–100.0)
Monocytes Absolute: 1 10*3/uL (ref 0.1–1.0)
Monocytes Relative: 7 %
Neutro Abs: 8 10*3/uL — ABNORMAL HIGH (ref 1.7–7.7)
Neutrophils Relative %: 57 %
Platelets: 308 10*3/uL (ref 150–400)
RBC: 4.64 MIL/uL (ref 4.22–5.81)
RDW: 12.9 % (ref 11.5–15.5)
WBC: 14 10*3/uL — ABNORMAL HIGH (ref 4.0–10.5)
nRBC: 0 % (ref 0.0–0.2)

## 2022-06-28 LAB — COMPREHENSIVE METABOLIC PANEL
ALT: 17 U/L (ref 0–44)
AST: 14 U/L — ABNORMAL LOW (ref 15–41)
Albumin: 4.2 g/dL (ref 3.5–5.0)
Alkaline Phosphatase: 72 U/L (ref 38–126)
Anion gap: 11 (ref 5–15)
BUN: 31 mg/dL — ABNORMAL HIGH (ref 6–20)
CO2: 23 mmol/L (ref 22–32)
Calcium: 9 mg/dL (ref 8.9–10.3)
Chloride: 104 mmol/L (ref 98–111)
Creatinine, Ser: 1.08 mg/dL (ref 0.61–1.24)
GFR, Estimated: >60 mL/min (ref >60)
Glucose, Bld: 123 mg/dL — ABNORMAL HIGH (ref 70–99)
Potassium: 4 mmol/L (ref 3.5–5.1)
Sodium: 138 mmol/L (ref 135–145)
Total Bilirubin: 0.6 mg/dL (ref 0.3–1.2)
Total Protein: 7.2 g/dL (ref 6.5–8.1)

## 2022-06-28 LAB — TROPONIN I (HIGH SENSITIVITY)
Troponin I (High Sensitivity): 3 ng/L (ref <18)
Troponin I (High Sensitivity): 18 ng/L — ABNORMAL HIGH (ref <18)

## 2022-06-28 LAB — LIPASE, BLOOD: Lipase: 13 U/L (ref 11–51)

## 2022-06-28 MED ORDER — PANTOPRAZOLE SODIUM 40 MG IV SOLR
40.0000 mg | Freq: Once | INTRAVENOUS | Status: AC
  Administered 2022-06-28: 40 mg via INTRAVENOUS
  Filled 2022-06-28: qty 10

## 2022-06-28 MED ORDER — SODIUM CHLORIDE 0.9 % IV BOLUS
1000.0000 mL | Freq: Once | INTRAVENOUS | Status: AC
  Administered 2022-06-28: 1000 mL via INTRAVENOUS

## 2022-06-28 MED ORDER — IOHEXOL 350 MG/ML SOLN
100.0000 mL | Freq: Once | INTRAVENOUS | Status: AC | PRN
  Administered 2022-06-28: 100 mL via INTRAVENOUS

## 2022-06-28 NOTE — ED Notes (Signed)
Patient transported to CT 

## 2022-06-28 NOTE — ED Provider Notes (Signed)
Emergency Department Provider Note   I have reviewed the triage vital signs and the nursing notes.   HISTORY  Chief Complaint spitting up blood   HPI Jerome Young is a 41 y.o. male past medical history of migraine headaches presents to the emergency department with 1 episode of coughing up blood today.  He states he felt like he had some phlegm in his throat or something there and so coughed and into the back of his mouth.  He was out in public and so went to the restroom and spit it out finding it to be bright red blood.  He states it looked like somewhat clotted and maroon-colored.  No black, coffee-ground material.  He is not feeling chest pain or shortness of breath.  He states he did have a migraine earlier in the week but that is resolved.  He occasionally has indigestion but no burning abdominal pain or other discomfort.  No fevers or chills.  He arrives with elevated heart rate but denies palpitations or other symptoms related to this. Denies any drugs or alcohol.  He is not on any prescription medicines.   Past Medical History:  Diagnosis Date   Migraines     Review of Systems  Constitutional: No fever/chills Cardiovascular: Denies chest pain. Respiratory: Denies shortness of breath. Positive hemoptysis.  Gastrointestinal: No abdominal pain.  No nausea, no vomiting.  No diarrhea.  Musculoskeletal: Negative for back pain. Skin: Negative for rash. Neurological: Negative for headaches.  ____________________________________________   PHYSICAL EXAM:  VITAL SIGNS: ED Triage Vitals  Enc Vitals Group     BP 06/28/22 1957 (!) 140/105     Pulse Rate 06/28/22 1957 (!) 140     Resp 06/28/22 1957 18     Temp 06/28/22 1957 98.6 F (37 C)     Temp Source 06/28/22 1957 Oral     SpO2 06/28/22 1957 100 %     Weight 06/28/22 2000 210 lb (95.3 kg)     Height 06/28/22 2000 5\' 11"  (1.803 m)   Constitutional: Alert and oriented. Well appearing and in no acute distress. Eyes:  Conjunctivae are normal.  Head: Atraumatic. Nose: No congestion/rhinnorhea. Mouth/Throat: Mucous membranes are moist.   Neck: No stridor. Cardiovascular: Tachycardia. Good peripheral circulation. Grossly normal heart sounds.   Respiratory: Normal respiratory effort.  No retractions. Lungs CTAB. Gastrointestinal: Soft and nontender. No distention.  Musculoskeletal: No lower extremity tenderness nor edema. No gross deformities of extremities. Neurologic:  Normal speech and language. No gross focal neurologic deficits are appreciated.  Skin:  Skin is warm, dry and intact. No rash noted.  ____________________________________________   LABS (all labs ordered are listed, but only abnormal results are displayed)  Labs Reviewed  COMPREHENSIVE METABOLIC PANEL - Abnormal; Notable for the following components:      Result Value   Glucose, Bld 123 (*)    BUN 31 (*)    AST 14 (*)    All other components within normal limits  CBC WITH DIFFERENTIAL/PLATELET - Abnormal; Notable for the following components:   WBC 14.0 (*)    Neutro Abs 8.0 (*)    Lymphs Abs 4.5 (*)    All other components within normal limits  TROPONIN I (HIGH SENSITIVITY) - Abnormal; Notable for the following components:   Troponin I (High Sensitivity) 18 (*)    All other components within normal limits  TROPONIN I (HIGH SENSITIVITY) - Abnormal; Notable for the following components:   Troponin I (High Sensitivity) 18 (*)  All other components within normal limits  LIPASE, BLOOD  TROPONIN I (HIGH SENSITIVITY)   ____________________________________________  EKG   EKG Interpretation  Date/Time:  Tuesday June 29 2022 00:48:57 EDT Ventricular Rate:  111 PR Interval:  151 QRS Duration: 79 QT Interval:  322 QTC Calculation: 438 R Axis:   51 Text Interpretation: Sinus tachycardia Abnormal R-wave progression, early transition Confirmed by Zadie Rhine ([id number]) on 06/29/2022 12:50:19 AM         ____________________________________________  RADIOLOGY  CT Angio Chest PE W and/or Wo Contrast  Result Date: 06/28/2022 CLINICAL DATA:  Hemoptysis EXAM: CT ANGIOGRAPHY CHEST CT ABDOMEN AND PELVIS WITH CONTRAST TECHNIQUE: Multidetector CT imaging of the chest was performed using the standard protocol during bolus administration of intravenous contrast. Multiplanar CT image reconstructions and MIPs were obtained to evaluate the vascular anatomy. Multidetector CT imaging of the abdomen and pelvis was performed using the standard protocol during bolus administration of intravenous contrast. RADIATION DOSE REDUCTION: This exam was performed according to the departmental dose-optimization program which includes automated exposure control, adjustment of the mA and/or kV according to patient size and/or use of iterative reconstruction technique. CONTRAST:  OMNIPAQUE IOHEXOL 350 MG/ML SOLN COMPARISON:  Chest x-ray from earlier in the same day. FINDINGS: CTA CHEST FINDINGS Cardiovascular: Thoracic aorta and its branches are within normal limits. No aneurysmal dilatation or dissection is seen. No cardiac enlargement is noted. No coronary calcifications are seen. The pulmonary artery shows a normal branching pattern bilaterally. No filling defect to suggest pulmonary embolism is seen. Mediastinum/Nodes: Thoracic inlet is within normal limits. No sizable hilar or mediastinal adenopathy is noted. The esophagus as visualized is within normal limits. Lungs/Pleura: The lungs are well aerated bilaterally. No focal infiltrate or effusion is seen. No sizable parenchymal nodule is noted. Musculoskeletal: No acute rib abnormality is noted. No compression deformity is seen. Review of the MIP images confirms the above findings. CT ABDOMEN and PELVIS FINDINGS Hepatobiliary: Fatty infiltration of the liver is noted. The gallbladder is within normal limits. Pancreas: Unremarkable. No pancreatic ductal dilatation or  surrounding inflammatory changes. Spleen: Normal in size without focal abnormality. Adrenals/Urinary Tract: Adrenal glands are within normal limits. Kidneys demonstrate a normal enhancement pattern bilaterally. No renal calculi or obstructive changes are seen. The ureters are within normal limits. Bladder is well distended. Stomach/Bowel: No obstructive or inflammatory changes of the colon are seen. The appendix is within normal limits. Small bowel and stomach are unremarkable. Vascular/Lymphatic: No significant vascular findings are present. No enlarged abdominal or pelvic lymph nodes. Reproductive: Prostate is unremarkable. Other: No abdominal wall hernia or abnormality. No abdominopelvic ascites. Musculoskeletal: No acute or significant osseous findings. Review of the MIP images confirms the above findings. IMPRESSION: CTA of the chest: No evidence of pulmonary embolism. No acute abnormality noted. CT of the abdomen and pelvis: Fatty liver. No other focal abnormality is noted. Electronically Signed   By: Alcide Clever M.D.   On: 06/28/2022 22:14   CT ABDOMEN PELVIS W CONTRAST  Result Date: 06/28/2022 CLINICAL DATA:  Hemoptysis EXAM: CT ANGIOGRAPHY CHEST CT ABDOMEN AND PELVIS WITH CONTRAST TECHNIQUE: Multidetector CT imaging of the chest was performed using the standard protocol during bolus administration of intravenous contrast. Multiplanar CT image reconstructions and MIPs were obtained to evaluate the vascular anatomy. Multidetector CT imaging of the abdomen and pelvis was performed using the standard protocol during bolus administration of intravenous contrast. RADIATION DOSE REDUCTION: This exam was performed according to the departmental dose-optimization program which includes automated  exposure control, adjustment of the mA and/or kV according to patient size and/or use of iterative reconstruction technique. CONTRAST:  OMNIPAQUE IOHEXOL 350 MG/ML SOLN COMPARISON:  Chest x-ray from earlier in the  same day. FINDINGS: CTA CHEST FINDINGS Cardiovascular: Thoracic aorta and its branches are within normal limits. No aneurysmal dilatation or dissection is seen. No cardiac enlargement is noted. No coronary calcifications are seen. The pulmonary artery shows a normal branching pattern bilaterally. No filling defect to suggest pulmonary embolism is seen. Mediastinum/Nodes: Thoracic inlet is within normal limits. No sizable hilar or mediastinal adenopathy is noted. The esophagus as visualized is within normal limits. Lungs/Pleura: The lungs are well aerated bilaterally. No focal infiltrate or effusion is seen. No sizable parenchymal nodule is noted. Musculoskeletal: No acute rib abnormality is noted. No compression deformity is seen. Review of the MIP images confirms the above findings. CT ABDOMEN and PELVIS FINDINGS Hepatobiliary: Fatty infiltration of the liver is noted. The gallbladder is within normal limits. Pancreas: Unremarkable. No pancreatic ductal dilatation or surrounding inflammatory changes. Spleen: Normal in size without focal abnormality. Adrenals/Urinary Tract: Adrenal glands are within normal limits. Kidneys demonstrate a normal enhancement pattern bilaterally. No renal calculi or obstructive changes are seen. The ureters are within normal limits. Bladder is well distended. Stomach/Bowel: No obstructive or inflammatory changes of the colon are seen. The appendix is within normal limits. Small bowel and stomach are unremarkable. Vascular/Lymphatic: No significant vascular findings are present. No enlarged abdominal or pelvic lymph nodes. Reproductive: Prostate is unremarkable. Other: No abdominal wall hernia or abnormality. No abdominopelvic ascites. Musculoskeletal: No acute or significant osseous findings. Review of the MIP images confirms the above findings. IMPRESSION: CTA of the chest: No evidence of pulmonary embolism. No acute abnormality noted. CT of the abdomen and pelvis: Fatty liver. No other  focal abnormality is noted. Electronically Signed   By: Alcide Clever M.D.   On: 06/28/2022 22:14   DG Chest Portable 1 View  Result Date: 06/28/2022 CLINICAL DATA:  Hemoptysis. EXAM: PORTABLE CHEST 1 VIEW COMPARISON:  None Available. FINDINGS: The heart size and mediastinal contours are within normal limits. No consolidation, effusion, or pneumothorax. No acute osseous abnormality. IMPRESSION: No active disease. Electronically Signed   By: Thornell Sartorius M.D.   On: 06/28/2022 20:47    ____________________________________________   PROCEDURES  Procedure(s) performed:   Procedures  None ____________________________________________   INITIAL IMPRESSION / ASSESSMENT AND PLAN / ED COURSE  Pertinent labs & imaging results that were available during my care of the patient were reviewed by me and considered in my medical decision making (see chart for details).   This patient is Presenting for Evaluation of hemoptysis, which does require a range of treatment options, and is a complaint that involves a high risk of morbidity and mortality.  The Differential Diagnoses include PE, bronchitis, GERD, esophagitis, etc.  Critical Interventions-    Medications  sodium chloride 0.9 % bolus 1,000 mL (0 mLs Intravenous Stopped 06/28/22 2259)  pantoprazole (PROTONIX) injection 40 mg (40 mg Intravenous Given 06/28/22 2042)  sodium chloride 0.9 % bolus 1,000 mL (0 mLs Intravenous Stopped 06/28/22 2259)  iohexol (OMNIPAQUE) 350 MG/ML injection 100 mL (100 mLs Intravenous Contrast Given 06/28/22 2131)    Reassessment after intervention:  Patient continues to feel well. No pain. No other symptoms. HR improved after IVF.    I did obtain Additional Historical Information from family at bedside.    Clinical Laboratory Tests Ordered, included CBC with normal Hb. Leukocytosis noted. Mild  troponin elevation, likely rate related. No AKI.   Radiologic Tests Ordered, included CXR, CTA chest, and CT  abdomen/pelvis. I independently interpreted the images and agree with radiology interpretation.   Cardiac Monitor Tracing which shows sinus tachycardia.    Social Determinants of Health Risk denies EtOH or drugs.   Medical Decision Making: Summary:  Patient describes single episode of hemoptysis.  He reports feeling lightheaded after seeing the blood but not previously.  Now he is feeling okay but arrives fairly tachycardic into the 150-130 range.  Does self describe feeling nervous but plan for IV fluids and work-up for hemoptysis.  No chest pain or shortness of breath PE remains in the differential.  Provide description does not seem as likely to represent esophagitis.  Reevaluation with update and discussion with patient. Continues to feel well. HR improved with IVF. Will obtain 3rd troponin given mild elevation. No additional hemoptysis. Not clear if this represents hemoptysis vs hematemesis but no return of symptoms, pain, and reassuring imaging. Care transferred to Dr. Christy Gentles.   Considered admission but will continue to monitor and trend troponin.   Disposition: pending  ____________________________________________  FINAL CLINICAL IMPRESSION(S) / ED DIAGNOSES  Final diagnoses:  Hemoptysis      Note:  This document was prepared using Dragon voice recognition software and may include unintentional dictation errors.  Nanda Slayden, MD, [facility]    Garth Diffley, [facility], MD 06/29/22 [phone]

## 2022-06-28 NOTE — ED Triage Notes (Addendum)
Pt states he was at son's soccer game and felt like he had to "spit up acid" and he spit up bright red blood x 1, states he did get lightheaded, resolved at present Denies any pain  Does take Tums daily but has not been diagnosed with reflux.   Hr elevated 145-150s States he is nervous  Repeated all vitals after he sat in triage 10 min Hr 152 Bp 143/115

## 2022-06-29 ENCOUNTER — Inpatient Hospital Stay (HOSPITAL_BASED_OUTPATIENT_CLINIC_OR_DEPARTMENT_OTHER)
Admission: EM | Admit: 2022-06-29 | Discharge: 2022-07-01 | DRG: 369 | Disposition: A | Discharge status: Home / Self Care | Payer: 59 | Attending: Internal Medicine | Admitting: Internal Medicine

## 2022-06-29 ENCOUNTER — Encounter (HOSPITAL_BASED_OUTPATIENT_CLINIC_OR_DEPARTMENT_OTHER): Payer: Self-pay | Admitting: Emergency Medicine

## 2022-06-29 ENCOUNTER — Ambulatory Visit: Payer: 59 | Admitting: Nurse Practitioner

## 2022-06-29 ENCOUNTER — Encounter (HOSPITAL_COMMUNITY): Payer: Self-pay

## 2022-06-29 ENCOUNTER — Other Ambulatory Visit: Payer: Self-pay

## 2022-06-29 ENCOUNTER — Ambulatory Visit: Payer: Self-pay

## 2022-06-29 DIAGNOSIS — Z791 Long term (current) use of non-steroidal anti-inflammatories (NSAID): Secondary | ICD-10-CM | POA: Diagnosis not present

## 2022-06-29 DIAGNOSIS — K760 Fatty (change of) liver, not elsewhere classified: Secondary | ICD-10-CM | POA: Diagnosis not present

## 2022-06-29 DIAGNOSIS — D72829 Elevated white blood cell count, unspecified: Secondary | ICD-10-CM | POA: Diagnosis not present

## 2022-06-29 DIAGNOSIS — R000 Tachycardia, unspecified: Secondary | ICD-10-CM | POA: Diagnosis present

## 2022-06-29 DIAGNOSIS — K226 Gastro-esophageal laceration-hemorrhage syndrome: Secondary | ICD-10-CM | POA: Diagnosis not present

## 2022-06-29 DIAGNOSIS — R042 Hemoptysis: Secondary | ICD-10-CM | POA: Diagnosis present

## 2022-06-29 DIAGNOSIS — D62 Acute posthemorrhagic anemia: Secondary | ICD-10-CM | POA: Diagnosis not present

## 2022-06-29 DIAGNOSIS — K921 Melena: Secondary | ICD-10-CM | POA: Diagnosis present

## 2022-06-29 DIAGNOSIS — K2901 Acute gastritis with bleeding: Secondary | ICD-10-CM | POA: Diagnosis not present

## 2022-06-29 DIAGNOSIS — Z79899 Other long term (current) drug therapy: Secondary | ICD-10-CM

## 2022-06-29 DIAGNOSIS — Z83438 Family history of other disorder of lipoprotein metabolism and other lipidemia: Secondary | ICD-10-CM

## 2022-06-29 DIAGNOSIS — T39395A Adverse effect of other nonsteroidal anti-inflammatory drugs [NSAID], initial encounter: Secondary | ICD-10-CM | POA: Diagnosis present

## 2022-06-29 DIAGNOSIS — Z886 Allergy status to analgesic agent status: Secondary | ICD-10-CM

## 2022-06-29 DIAGNOSIS — Z818 Family history of other mental and behavioral disorders: Secondary | ICD-10-CM

## 2022-06-29 DIAGNOSIS — Z880 Allergy status to penicillin: Secondary | ICD-10-CM

## 2022-06-29 DIAGNOSIS — K922 Gastrointestinal hemorrhage, unspecified: Secondary | ICD-10-CM | POA: Diagnosis present

## 2022-06-29 DIAGNOSIS — Z8249 Family history of ischemic heart disease and other diseases of the circulatory system: Secondary | ICD-10-CM | POA: Diagnosis not present

## 2022-06-29 DIAGNOSIS — Z91040 Latex allergy status: Secondary | ICD-10-CM | POA: Diagnosis not present

## 2022-06-29 DIAGNOSIS — K2101 Gastro-esophageal reflux disease with esophagitis, with bleeding: Secondary | ICD-10-CM | POA: Diagnosis present

## 2022-06-29 DIAGNOSIS — G43909 Migraine, unspecified, not intractable, without status migrainosus: Secondary | ICD-10-CM | POA: Diagnosis present

## 2022-06-29 LAB — TROPONIN I (HIGH SENSITIVITY): Troponin I (High Sensitivity): 18 ng/L — ABNORMAL HIGH (ref <18)

## 2022-06-29 LAB — HEMOGLOBIN AND HEMATOCRIT, BLOOD
HCT: 31.7 % — ABNORMAL LOW (ref 39.0–52.0)
HCT: 33.5 % — ABNORMAL LOW (ref 39.0–52.0)
Hemoglobin: 10.6 g/dL — ABNORMAL LOW (ref 13.0–17.0)
Hemoglobin: 11.2 g/dL — ABNORMAL LOW (ref 13.0–17.0)

## 2022-06-29 LAB — OCCULT BLOOD X 1 CARD TO LAB, STOOL: Fecal Occult Bld: POSITIVE — AB

## 2022-06-29 MED ORDER — PROCHLORPERAZINE EDISYLATE 10 MG/2ML IJ SOLN: 5.0000 mg | Freq: Four times a day (QID) | INTRAMUSCULAR | Status: DC | PRN

## 2022-06-29 MED ORDER — PANTOPRAZOLE SODIUM 40 MG IV SOLR
40.0000 mg | Freq: Once | INTRAVENOUS | Status: AC
  Administered 2022-06-29: 40 mg via INTRAVENOUS
  Filled 2022-06-29: qty 10

## 2022-06-29 MED ORDER — SODIUM CHLORIDE 0.9 % IV BOLUS
1000.0000 mL | Freq: Once | INTRAVENOUS | Status: AC
  Administered 2022-06-29: 1000 mL via INTRAVENOUS

## 2022-06-29 MED ORDER — PANTOPRAZOLE SODIUM 40 MG IV SOLR
40.0000 mg | Freq: Two times a day (BID) | INTRAVENOUS | Status: DC
  Administered 2022-06-29 – 2022-07-01 (×4): 40 mg via INTRAVENOUS
  Filled 2022-06-29 (×4): qty 10

## 2022-06-29 MED ORDER — MELATONIN 5 MG PO TABS
5.0000 mg | ORAL_TABLET | Freq: Every evening | ORAL | Status: DC | PRN
  Filled 2022-06-29: qty 1

## 2022-06-29 MED ORDER — ACETAMINOPHEN 325 MG PO TABS: 650.0000 mg | ORAL_TABLET | Freq: Four times a day (QID) | ORAL | Status: DC | PRN

## 2022-06-29 MED ORDER — SODIUM CHLORIDE 0.9 % IV SOLN: INTRAVENOUS | Status: DC

## 2022-06-29 NOTE — Telephone Encounter (Signed)
  Chief Complaint: Black stool Symptoms: ibid Frequency: this morning Pertinent Negatives: Patient denies  Disposition: [x] ED /[] Urgent Care (no appt availability in office) / [] Appointment(In office/virtual)/ []  [facility]/ [] Home Care/ [] Refused Recommended Disposition /[] [facility]/ []  Follow-up with PCP Additional Notes: Pt seen yesterday at ED for HTN increased HR, and coughing up blood. Pt  told to return for black stool.     Reason for Disposition  Black or tarry bowel movements  (Exception: Chronic-unchanged black-grey BMs AND is taking iron pills or Pepto-Bismol.)  Answer Assessment - Initial Assessment Questions 1. APPEARANCE of BLOOD: "What color is it?" "Is it passed separately, on the surface of the stool, or mixed in with the stool?"      Black  2. AMOUNT: "How much blood was passed?"       3. FREQUENCY: "How many times has blood been passed with the stools?"      1X this morning 4. ONSET: "When was the blood first seen in the stools?" (Days or weeks)      1 day  5. DIARRHEA: "Is there also some diarrhea?" If Yes, ask: "How many diarrhea stools in the past 24 hours?"       6. CONSTIPATION: "Do you have constipation?" If Yes, ask: "How bad is it?"      7. RECURRENT SYMPTOMS: "Have you had blood in your stools before?" If Yes, ask: "When was the last time?" and "What happened that time?"      no 8. BLOOD THINNERS: "Do you take any blood thinners?" (e.g., Coumadin/warfarin, Pradaxa/dabigatran, aspirin)     no 9. OTHER SYMPTOMS: "Do you have any other symptoms?"  (e.g., abdomen pain, vomiting, dizziness, fever)     See note from ED encounter 10. PREGNANCY: "Is there any chance you are pregnant?" "When was your last menstrual period?"       na  Protocols used: Rectal Bleeding-A-AH

## 2022-06-29 NOTE — ED Notes (Signed)
Reviewed AVS/discharge instruction with patient. Time allotted for and all questions answered. Patient is agreeable for d/c and escorted to ed exit by staff.  

## 2022-06-29 NOTE — H&P (Signed)
History and Physical  Jerome Young SWN:[id number] DOB: May 01, 1981 DOA: 06/29/2022  Referring physician: Accepted by Dr. Alanda Slim [facility], hospitalist service. PCP: Gwenlyn Fudge, FNP  Outpatient Specialists: None. Patient coming from: Home through [facility] ED.  Chief Complaint: Watery dark stools.  HPI: Jerome Young is a 41 y.o. male with medical history significant for migraine headaches with frequent use of NSAIDs (ibuprofen, Aleve, Goody powder), seen in the ED the day prior with spitting up blood.  Had an extensive work-up that was negative.  CTA chest negative for PE.  CT abdomen and pelvis with contrast no acute finding except for fatty liver.    He returns to the ED today with melena.  Noted to have a drop in hemoglobin, about 3 g, with positive Hemoccult.  Initially tachycardic and hypertensive but vital signs normalized.  High-sensitivity troponin x2 were flat at 18.  EKG showing sinus tachycardia 111.  EDP discussed the case with [facility] Dr. Bosie Clos.  Recommended admission to [facility] for EGD on Thursday.    The patient denies lightheadedness or dizziness at this time.  Endorses last use of Goody powders for headache was about 2 weeks ago.  Takes Pepto-Bismol occasionally for acid reflux.  The patient received a dose of IV Protonix 40 mg x 1 and 1 L normal saline bolus x1 in the ED.  EDP requested admission for further management.    The patient was admitted by [facility], hospitalist service, Dr. Alanda Slim, and transferred to [facility] long hospital telemetry unit as observation status.  The patient was seen and examined with his wife at his bedside.  He denies any abdominal pain at the time of this visit.  No nausea or vomiting.  No recurrent stools since admission.  ED Course: Tmax 98.8.  BP 120/93, pulse 93, respiration rate 20, O2 saturation 100% on room air.  Lab studies remarkable for WBC 14.0, hemoglobin 10.6 from 13.1, neutrophil 8.0, lymphocyte 4.5.  Review of  Systems: Review of systems as noted in the HPI. All other systems reviewed and are negative.   Past Medical History:  Diagnosis Date   Migraines    History reviewed. No pertinent surgical history.  Social History:  reports that he has never smoked. He has never used smokeless tobacco. He reports that he does not currently use alcohol. He reports that he does not use drugs.   Allergies  Allergen Reactions   Naproxen Palpitations   Penicillins Other (See Comments)    Mother states he is allergic   Latex Rash    Family History  Problem Relation Age of Onset   Anxiety disorder Mother    Depression Mother    Anxiety disorder Sister    Depression Sister    Hypertension Maternal Grandmother    Hyperlipidemia Maternal Grandmother    Hypertension Maternal Grandfather       Prior to Admission medications   Medication Sig Start Date End Date Taking? Authorizing Provider  Aspirin-Acetaminophen-Caffeine (GOODYS EXTRA STRENGTH PO) Take by mouth.   Yes [provider]    Physical Exam: BP (!) 130/93 (BP Location: Right Arm)   Pulse 93   Temp 97.9 F (36.6 C) (Oral)   Resp 20   Ht 5\' 11"  (1.803 m)   Wt 95.3 kg   SpO2 100%   BMI 29.30 kg/m   General: 41 y.o. year-old male well developed well nourished in no acute distress.  Alert and oriented x3. Cardiovascular: Regular rate and rhythm with no rubs or gallops.  No thyromegaly or JVD noted.  No lower extremity edema. 2/4 pulses in all 4 extremities. Respiratory: Clear to auscultation with no wheezes or rales. Good inspiratory effort. Abdomen: Soft nontender nondistended with normal bowel sounds x4 quadrants. Muskuloskeletal: No cyanosis, clubbing or edema noted bilaterally Neuro: CN II-XII intact, strength, sensation, reflexes Skin: No ulcerative lesions noted or rashes Psychiatry: Judgement and insight appear normal. Mood is appropriate for condition and setting          Labs on Admission:  Basic Metabolic  Panel: Recent Labs  Lab 06/28/22 2026  NA 138  K 4.0  CL 104  CO2 23  GLUCOSE 123*  BUN 31*  CREATININE 1.08  CALCIUM 9.0   Liver Function Tests: Recent Labs  Lab 06/28/22 2026  AST 14*  ALT 17  ALKPHOS 72  BILITOT 0.6  PROT 7.2  ALBUMIN 4.2   Recent Labs  Lab 06/28/22 2026  LIPASE 13   No results for input(s): "AMMONIA" in the last 168 hours. CBC: Recent Labs  Lab 06/28/22 2026 06/29/22 1147  WBC 14.0*  --   NEUTROABS 8.0*  --   HGB 13.1 10.6*  HCT 40.6 31.7*  MCV 87.5  --   PLT 308  --    Cardiac Enzymes: No results for input(s): "CKTOTAL", "CKMB", "CKMBINDEX", "TROPONINI" in the last 168 hours.  BNP (last 3 results) No results for input(s): "BNP" in the last 8760 hours.  ProBNP (last 3 results) No results for input(s): "PROBNP" in the last 8760 hours.  CBG: No results for input(s): "GLUCAP" in the last 168 hours.  Radiological Exams on Admission: CT Angio Chest PE W and/or Wo Contrast  Result Date: 06/28/2022 CLINICAL DATA:  Hemoptysis EXAM: CT ANGIOGRAPHY CHEST CT ABDOMEN AND PELVIS WITH CONTRAST TECHNIQUE: Multidetector CT imaging of the chest was performed using the standard protocol during bolus administration of intravenous contrast. Multiplanar CT image reconstructions and MIPs were obtained to evaluate the vascular anatomy. Multidetector CT imaging of the abdomen and pelvis was performed using the standard protocol during bolus administration of intravenous contrast. RADIATION DOSE REDUCTION: This exam was performed according to the departmental dose-optimization program which includes automated exposure control, adjustment of the mA and/or kV according to patient size and/or use of iterative reconstruction technique. CONTRAST:  OMNIPAQUE IOHEXOL 350 MG/ML SOLN COMPARISON:  Chest x-ray from earlier in the same day. FINDINGS: CTA CHEST FINDINGS Cardiovascular: Thoracic aorta and its branches are within normal limits. No aneurysmal dilatation or  dissection is seen. No cardiac enlargement is noted. No coronary calcifications are seen. The pulmonary artery shows a normal branching pattern bilaterally. No filling defect to suggest pulmonary embolism is seen. Mediastinum/Nodes: Thoracic inlet is within normal limits. No sizable hilar or mediastinal adenopathy is noted. The esophagus as visualized is within normal limits. Lungs/Pleura: The lungs are well aerated bilaterally. No focal infiltrate or effusion is seen. No sizable parenchymal nodule is noted. Musculoskeletal: No acute rib abnormality is noted. No compression deformity is seen. Review of the MIP images confirms the above findings. CT ABDOMEN and PELVIS FINDINGS Hepatobiliary: Fatty infiltration of the liver is noted. The gallbladder is within normal limits. Pancreas: Unremarkable. No pancreatic ductal dilatation or surrounding inflammatory changes. Spleen: Normal in size without focal abnormality. Adrenals/Urinary Tract: Adrenal glands are within normal limits. Kidneys demonstrate a normal enhancement pattern bilaterally. No renal calculi or obstructive changes are seen. The ureters are within normal limits. Bladder is well distended. Stomach/Bowel: No obstructive or inflammatory changes of the colon are  seen. The appendix is within normal limits. Small bowel and stomach are unremarkable. Vascular/Lymphatic: No significant vascular findings are present. No enlarged abdominal or pelvic lymph nodes. Reproductive: Prostate is unremarkable. Other: No abdominal wall hernia or abnormality. No abdominopelvic ascites. Musculoskeletal: No acute or significant osseous findings. Review of the MIP images confirms the above findings. IMPRESSION: CTA of the chest: No evidence of pulmonary embolism. No acute abnormality noted. CT of the abdomen and pelvis: Fatty liver. No other focal abnormality is noted. Electronically Signed   By: Alcide Clever M.D.   On: 06/28/2022 22:14   CT ABDOMEN PELVIS W CONTRAST  Result  Date: 06/28/2022 CLINICAL DATA:  Hemoptysis EXAM: CT ANGIOGRAPHY CHEST CT ABDOMEN AND PELVIS WITH CONTRAST TECHNIQUE: Multidetector CT imaging of the chest was performed using the standard protocol during bolus administration of intravenous contrast. Multiplanar CT image reconstructions and MIPs were obtained to evaluate the vascular anatomy. Multidetector CT imaging of the abdomen and pelvis was performed using the standard protocol during bolus administration of intravenous contrast. RADIATION DOSE REDUCTION: This exam was performed according to the departmental dose-optimization program which includes automated exposure control, adjustment of the mA and/or kV according to patient size and/or use of iterative reconstruction technique. CONTRAST:  OMNIPAQUE IOHEXOL 350 MG/ML SOLN COMPARISON:  Chest x-ray from earlier in the same day. FINDINGS: CTA CHEST FINDINGS Cardiovascular: Thoracic aorta and its branches are within normal limits. No aneurysmal dilatation or dissection is seen. No cardiac enlargement is noted. No coronary calcifications are seen. The pulmonary artery shows a normal branching pattern bilaterally. No filling defect to suggest pulmonary embolism is seen. Mediastinum/Nodes: Thoracic inlet is within normal limits. No sizable hilar or mediastinal adenopathy is noted. The esophagus as visualized is within normal limits. Lungs/Pleura: The lungs are well aerated bilaterally. No focal infiltrate or effusion is seen. No sizable parenchymal nodule is noted. Musculoskeletal: No acute rib abnormality is noted. No compression deformity is seen. Review of the MIP images confirms the above findings. CT ABDOMEN and PELVIS FINDINGS Hepatobiliary: Fatty infiltration of the liver is noted. The gallbladder is within normal limits. Pancreas: Unremarkable. No pancreatic ductal dilatation or surrounding inflammatory changes. Spleen: Normal in size without focal abnormality. Adrenals/Urinary Tract: Adrenal glands  are within normal limits. Kidneys demonstrate a normal enhancement pattern bilaterally. No renal calculi or obstructive changes are seen. The ureters are within normal limits. Bladder is well distended. Stomach/Bowel: No obstructive or inflammatory changes of the colon are seen. The appendix is within normal limits. Small bowel and stomach are unremarkable. Vascular/Lymphatic: No significant vascular findings are present. No enlarged abdominal or pelvic lymph nodes. Reproductive: Prostate is unremarkable. Other: No abdominal wall hernia or abnormality. No abdominopelvic ascites. Musculoskeletal: No acute or significant osseous findings. Review of the MIP images confirms the above findings. IMPRESSION: CTA of the chest: No evidence of pulmonary embolism. No acute abnormality noted. CT of the abdomen and pelvis: Fatty liver. No other focal abnormality is noted. Electronically Signed   By: Alcide Clever M.D.   On: 06/28/2022 22:14   DG Chest Portable 1 View  Result Date: 06/28/2022 CLINICAL DATA:  Hemoptysis. EXAM: PORTABLE CHEST 1 VIEW COMPARISON:  None Available. FINDINGS: The heart size and mediastinal contours are within normal limits. No consolidation, effusion, or pneumothorax. No acute osseous abnormality. IMPRESSION: No active disease. Electronically Signed   By: Thornell Sartorius M.D.   On: 06/28/2022 20:47    EKG: I independently viewed the EKG done and my findings are as followed: Sinus  tachycardia rate of 111.  Nonspecific ST-T changes.  QTc 438.  Assessment/Plan Present on Admission:  Melena  Principal Problem:   Melena  Melena with concern for upper GI bleed Presented the day before with spitting up blood Watery dark stools today with drop in hemoglobin from 13 to 10 Received a dose of IV Protonix in the ED, continue with IV Protonix 40 mg twice daily Patient counseled on the importance of avoiding chronic NSAID use.  He was receptive. GI consulted.  Appreciate assistance N.p.o. after  midnight until seen by GI Denies any abdominal pain or recurrent stools at the time of this visit. Serial H&H every 6 hours Gentle IV fluid hydration NS at 100 cc/h x 2 days. Transfuse as indicated  Acute blood loss anemia likely secondary to upper GI bleed Baseline hemoglobin 13.1 Presented with hemoglobin of 10.6. Monitor H&H every 6 hours Transfuse as indicated  Leukocytosis, suspect reactive in the setting of GI bleed No evidence of active infective process, afebrile Nonseptic appearing. Repeat CBC with differential in the morning  GERD Continue PPI IV Protonix 40 mg twice daily    DVT prophylaxis: SCDs.  Pharmacological DVT prophylaxis contraindicated in the setting of GI bleed  Code Status: Full code  Family Communication: Updated his wife at bedside  Disposition Plan: Admitted to telemetry unit  Consults called: GI consulted.  Admission status: Observation status.   Status is: Observation    Kayleen Memos MD [facility] Pager [phone]  If 7PM-7AM, please contact night-coverage [email or web address] Password [email or web address]  06/29/2022, 8:22 PM

## 2022-06-29 NOTE — Plan of Care (Signed)

## 2022-06-29 NOTE — ED Provider Notes (Signed)
EKG Interpretation  Date/Time:  Tuesday June 29 2022 00:48:57 EDT Ventricular Rate:  111 PR Interval:  151 QRS Duration: 79 QT Interval:  322 QTC Calculation: 438 R Axis:   51 Text Interpretation: Sinus tachycardia Abnormal R-wave progression, early transition Confirmed by Ripley Fraise [phone]) on 06/29/2022 12:50:19 AM       Overall patient is improved.  Repeat EKG shows improved heart rhythm.  Troponin is flat.  He has had no new symptoms.  Work-up overall reassuring.  CT imaging negative.  From my history, difficult to determine if this was actual hemoptysis versus hematemesis.  Patient does report eating spicy food earlier in the day that could have contributed to this.  He denies any recent history of GI bleed.  He is low risk for GI bleed.  No epistaxis. He will be discharged home.  We will give referral to GI.  We discussed strict return precautions.    Ripley Fraise, MD 06/29/22 [phone]

## 2022-06-29 NOTE — ED Notes (Signed)
ED Provider at bedside. 

## 2022-06-29 NOTE — ED Provider Notes (Signed)
MEDCENTER [facility] EMERGENCY DEPT Provider Note   CSN: [id number] Arrival date & time: 06/29/22  0915     History  Chief Complaint  Patient presents with   Melena    Jerome Young is a 41 y.o. male who presents to the emergency department with concerns for melena onset this morning. He notes that this morning he woke up and had 1 black stool that was nonbloody.  Notes that he also additionally had a couple episodes of black watery diarrhea. Patient denies any additional episodes of hemoptysis or hematemesis since he was evaluated in the ED yesterday.  Has not had a chance to call the GI specialist as of yet.  No additional medications tried prior to arrival.  Patient notes that he has not taken any Pepto-Bismol prior to.  Notes that couple weeks ago he was taking BC powders for his headache.  Denies chest pain, shortness of breath, palpitations, abdominal pain, nausea, vomiting, dizziness, lightheadedness.  Per pt chart review: Pt was evaluated in the ED for hemoptysis.  Patient had an extensive work-up consistent of chest pain work-up, PE work-up, CT abdomen pelvis.  No acute abnormalities noted on imaging studies.  Initial Trop was 3, delta Trop and third Trop remain flat at 18.  Patient discharged home with instructions to follow-up with a GI specialist.  The history is provided by the patient. No language interpreter was used.       Home Medications Prior to Admission medications   Not on File      Allergies    Naproxen and Penicillins    Review of Systems   Review of Systems  All other systems reviewed and are negative.   Physical Exam Updated Vital Signs BP (!) 137/101   Pulse (!) 129   Temp 98.8 F (37.1 C)   Resp 16   Ht 5\' 11"  (1.803 m)   Wt 95.3 kg   SpO2 100%   BMI 29.30 kg/m  Physical Exam Vitals and nursing note reviewed. Exam conducted with a chaperone present.  Constitutional:      General: He is not in acute distress.    Appearance: He is  not diaphoretic.  HENT:     Head: Normocephalic and atraumatic.     Mouth/Throat:     Pharynx: No oropharyngeal exudate.  Eyes:     General: No scleral icterus.    Conjunctiva/sclera: Conjunctivae normal.  Cardiovascular:     Rate and Rhythm: Normal rate and regular rhythm.     Pulses: Normal pulses.     Heart sounds: Normal heart sounds.  Pulmonary:     Effort: Pulmonary effort is normal. No respiratory distress.     Breath sounds: Normal breath sounds. No wheezing.  Abdominal:     General: Bowel sounds are normal.     Palpations: Abdomen is soft. There is no mass.     Tenderness: There is no abdominal tenderness. There is no guarding or rebound.  Genitourinary:    Rectum: No mass, tenderness, anal fissure or external hemorrhoid. Normal anal tone.     Comments: RN chaperone present for rectal exam.  No external hemorrhoids.  Normal anal tone.  No appreciable mass or tenderness to palpation noted on rectal exam.  No obvious anal fissures noted Musculoskeletal:        General: Normal range of motion.     Cervical back: Normal range of motion and neck supple.  Skin:    General: Skin is warm and dry.  Neurological:  Mental Status: He is alert.  Psychiatric:        Behavior: Behavior normal.     ED Results / Procedures / Treatments   Labs (all labs ordered are listed, but only abnormal results are displayed) Labs Reviewed  HEMOGLOBIN AND HEMATOCRIT, BLOOD  POC OCCULT BLOOD, ED    EKG None  Radiology CT Angio Chest PE W and/or Wo Contrast  Result Date: 06/28/2022 CLINICAL DATA:  Hemoptysis EXAM: CT ANGIOGRAPHY CHEST CT ABDOMEN AND PELVIS WITH CONTRAST TECHNIQUE: Multidetector CT imaging of the chest was performed using the standard protocol during bolus administration of intravenous contrast. Multiplanar CT image reconstructions and MIPs were obtained to evaluate the vascular anatomy. Multidetector CT imaging of the abdomen and pelvis was performed using the standard  protocol during bolus administration of intravenous contrast. RADIATION DOSE REDUCTION: This exam was performed according to the departmental dose-optimization program which includes automated exposure control, adjustment of the mA and/or kV according to patient size and/or use of iterative reconstruction technique. CONTRAST:  192mL OMNIPAQUE IOHEXOL 350 MG/ML SOLN COMPARISON:  Chest x-ray from earlier in the same day. FINDINGS: CTA CHEST FINDINGS Cardiovascular: Thoracic aorta and its branches are within normal limits. No aneurysmal dilatation or dissection is seen. No cardiac enlargement is noted. No coronary calcifications are seen. The pulmonary artery shows a normal branching pattern bilaterally. No filling defect to suggest pulmonary embolism is seen. Mediastinum/Nodes: Thoracic inlet is within normal limits. No sizable hilar or mediastinal adenopathy is noted. The esophagus as visualized is within normal limits. Lungs/Pleura: The lungs are well aerated bilaterally. No focal infiltrate or effusion is seen. No sizable parenchymal nodule is noted. Musculoskeletal: No acute rib abnormality is noted. No compression deformity is seen. Review of the MIP images confirms the above findings. CT ABDOMEN and PELVIS FINDINGS Hepatobiliary: Fatty infiltration of the liver is noted. The gallbladder is within normal limits. Pancreas: Unremarkable. No pancreatic ductal dilatation or surrounding inflammatory changes. Spleen: Normal in size without focal abnormality. Adrenals/Urinary Tract: Adrenal glands are within normal limits. Kidneys demonstrate a normal enhancement pattern bilaterally. No renal calculi or obstructive changes are seen. The ureters are within normal limits. Bladder is well distended. Stomach/Bowel: No obstructive or inflammatory changes of the colon are seen. The appendix is within normal limits. Small bowel and stomach are unremarkable. Vascular/Lymphatic: No significant vascular findings are present. No  enlarged abdominal or pelvic lymph nodes. Reproductive: Prostate is unremarkable. Other: No abdominal wall hernia or abnormality. No abdominopelvic ascites. Musculoskeletal: No acute or significant osseous findings. Review of the MIP images confirms the above findings. IMPRESSION: CTA of the chest: No evidence of pulmonary embolism. No acute abnormality noted. CT of the abdomen and pelvis: Fatty liver. No other focal abnormality is noted. Electronically Signed   By: Inez Catalina M.D.   On: 06/28/2022 22:14   CT ABDOMEN PELVIS W CONTRAST  Result Date: 06/28/2022 CLINICAL DATA:  Hemoptysis EXAM: CT ANGIOGRAPHY CHEST CT ABDOMEN AND PELVIS WITH CONTRAST TECHNIQUE: Multidetector CT imaging of the chest was performed using the standard protocol during bolus administration of intravenous contrast. Multiplanar CT image reconstructions and MIPs were obtained to evaluate the vascular anatomy. Multidetector CT imaging of the abdomen and pelvis was performed using the standard protocol during bolus administration of intravenous contrast. RADIATION DOSE REDUCTION: This exam was performed according to the departmental dose-optimization program which includes automated exposure control, adjustment of the mA and/or kV according to patient size and/or use of iterative reconstruction technique. CONTRAST:  171mL OMNIPAQUE IOHEXOL 350  MG/ML SOLN COMPARISON:  Chest x-ray from earlier in the same day. FINDINGS: CTA CHEST FINDINGS Cardiovascular: Thoracic aorta and its branches are within normal limits. No aneurysmal dilatation or dissection is seen. No cardiac enlargement is noted. No coronary calcifications are seen. The pulmonary artery shows a normal branching pattern bilaterally. No filling defect to suggest pulmonary embolism is seen. Mediastinum/Nodes: Thoracic inlet is within normal limits. No sizable hilar or mediastinal adenopathy is noted. The esophagus as visualized is within normal limits. Lungs/Pleura: The lungs are  well aerated bilaterally. No focal infiltrate or effusion is seen. No sizable parenchymal nodule is noted. Musculoskeletal: No acute rib abnormality is noted. No compression deformity is seen. Review of the MIP images confirms the above findings. CT ABDOMEN and PELVIS FINDINGS Hepatobiliary: Fatty infiltration of the liver is noted. The gallbladder is within normal limits. Pancreas: Unremarkable. No pancreatic ductal dilatation or surrounding inflammatory changes. Spleen: Normal in size without focal abnormality. Adrenals/Urinary Tract: Adrenal glands are within normal limits. Kidneys demonstrate a normal enhancement pattern bilaterally. No renal calculi or obstructive changes are seen. The ureters are within normal limits. Bladder is well distended. Stomach/Bowel: No obstructive or inflammatory changes of the colon are seen. The appendix is within normal limits. Small bowel and stomach are unremarkable. Vascular/Lymphatic: No significant vascular findings are present. No enlarged abdominal or pelvic lymph nodes. Reproductive: Prostate is unremarkable. Other: No abdominal wall hernia or abnormality. No abdominopelvic ascites. Musculoskeletal: No acute or significant osseous findings. Review of the MIP images confirms the above findings. IMPRESSION: CTA of the chest: No evidence of pulmonary embolism. No acute abnormality noted. CT of the abdomen and pelvis: Fatty liver. No other focal abnormality is noted. Electronically Signed   By: Alcide Clever M.D.   On: 06/28/2022 22:14   DG Chest Portable 1 View  Result Date: 06/28/2022 CLINICAL DATA:  Hemoptysis. EXAM: PORTABLE CHEST 1 VIEW COMPARISON:  None Available. FINDINGS: The heart size and mediastinal contours are within normal limits. No consolidation, effusion, or pneumothorax. No acute osseous abnormality. IMPRESSION: No active disease. Electronically Signed   By: Thornell Sartorius M.D.   On: 06/28/2022 20:47    Procedures Procedures    Medications Ordered in  ED Medications  sodium chloride 0.9 % bolus 1,000 mL (has no administration in time range)    ED Course/ Medical Decision Making/ A&P Clinical Course as of 06/29/22 1532  Tue Jun 29, 2022  1056 Fecal Occult Blood, POC(!): POSITIVE [SB]  1202 Hemoglobin(!): 10.6 [SB]  1218 Re-evaluated and discussed with patient GI consult plans. Pt agreeable at this time.  [SB]  1322 Consult to GI, Dr. Bosie Clos who recommends admission for endoscopy and GI will follow.  [SB]  1330 Discussed with patient plans for admission. Pt agreeable at this time. [SB]  1409 Consult to hospitalist, Dr. Alanda Slim who agrees with admission at this time. [SB]    Clinical Course User Index [SB] Alysiana Ethridge A, PA-C                           Medical Decision Making Amount and/or Complexity of Data Reviewed Labs: ordered. Decision-making details documented in ED Course.  Risk Prescription drug management. Decision regarding hospitalization.   Pt presents with melena onset this morning. No meds tried PTA. Hasn't had a chance to establish care with GI yet. No chest pain, shortness of breath, abdominal pain, nausea, vomiting, dizziness, lightheadedness. Vital signs, pt afebrile, slightly tachycardic at 129. On exam, pt  with RN chaperone present for rectal exam.  No external hemorrhoids.  Normal anal tone.  No appreciable mass or tenderness to palpation noted on rectal exam.  No obvious anal fissures noted. No acute cardiovascular, respiratory, abdominal exam findings. Differential diagnosis includes upper GI bleed, hemorrhoids, anal fissure.    Additional history obtained:  Additional history obtained from Spouse/Significant Other External records from outside source obtained and reviewed including:  Pt was evaluated in the ED for hemoptysis.  Patient had an extensive work-up consistent of chest pain work-up, PE work-up, CT abdomen pelvis.  No acute abnormalities noted on imaging studies.  Initial Trop was 3, delta Trop and  third Trop remain flat at 18.  Patient discharged home with instructions to follow-up with a GI specialist.  Labs:  I ordered, and personally interpreted labs.  The pertinent results include:   Hemoccult positive H&H downtrending at 10.1 (hemoglobin on yesterday was 13.6)  Medications:  I ordered medication including IVF and Protonix for symptom management Reevaluation of the patient after these medicines and interventions, I reevaluated the patient and found that they have improved I have reviewed the patients home medicines and have made adjustments as needed   Consultations: I requested consultation with the Gastroenterologist, Dr. Bosie Clos and discussed lab and imaging findings as well as pertinent plan - they recommend: Admission to the hospital with an endoscopy. -Consultation with hospitalist, Dr. Alanda Slim who agrees with admission at this time.    Disposition: Please stay suspicious for upper GI bleed.  Doubt anal fissures or hemorrhoids at this time. After consideration of the diagnostic results and the patients response to treatment, I feel that the patient would benefit from Admission to the hospital.  Discussed with patient and wife regarding plans for admission to the hospital.  Answered all available questions.  Patient agreeable with admission at this time.  Patient appears safe for admission.  This chart was dictated using voice recognition software, Dragon. Despite the best efforts of this provider to proofread and correct errors, errors may still occur which can change documentation meaning.   Final Clinical Impression(s) / ED Diagnoses Final diagnoses:  Melena  Upper GI bleed    Rx / DC Orders ED Discharge Orders     None         Traevon Meiring A, PA-C 06/29/22 1551    Melene Plan, DO 06/30/22 3810

## 2022-06-29 NOTE — ED Notes (Signed)
Patient, belongings, paperwork transferred to [facility] at this time via [facility] 

## 2022-06-29 NOTE — ED Triage Notes (Signed)
Pt returns via pov from home; states he was told to come back if he had black stool, and he had black, watery stool this morning. Denies any more spitting up blood; denies lightheadedness or dizziness at this time. Pt alert & oriented, nad noted.

## 2022-06-30 ENCOUNTER — Encounter (HOSPITAL_COMMUNITY)
Admission: EM | Disposition: A | Discharge status: Home / Self Care | Payer: Self-pay | Source: Home / Self Care | Attending: Internal Medicine

## 2022-06-30 ENCOUNTER — Ambulatory Visit: Payer: 59 | Admitting: Gastroenterology

## 2022-06-30 ENCOUNTER — Inpatient Hospital Stay (HOSPITAL_COMMUNITY): Payer: 59 | Admitting: Anesthesiology

## 2022-06-30 DIAGNOSIS — K2901 Acute gastritis with bleeding: Secondary | ICD-10-CM

## 2022-06-30 DIAGNOSIS — K760 Fatty (change of) liver, not elsewhere classified: Secondary | ICD-10-CM | POA: Diagnosis present

## 2022-06-30 DIAGNOSIS — K3189 Other diseases of stomach and duodenum: Secondary | ICD-10-CM | POA: Diagnosis not present

## 2022-06-30 DIAGNOSIS — K264 Chronic or unspecified duodenal ulcer with hemorrhage: Secondary | ICD-10-CM | POA: Diagnosis not present

## 2022-06-30 DIAGNOSIS — Z8249 Family history of ischemic heart disease and other diseases of the circulatory system: Secondary | ICD-10-CM | POA: Diagnosis not present

## 2022-06-30 DIAGNOSIS — D72829 Elevated white blood cell count, unspecified: Secondary | ICD-10-CM | POA: Diagnosis present

## 2022-06-30 DIAGNOSIS — K2091 Esophagitis, unspecified with bleeding: Secondary | ICD-10-CM

## 2022-06-30 DIAGNOSIS — Z886 Allergy status to analgesic agent status: Secondary | ICD-10-CM | POA: Diagnosis not present

## 2022-06-30 DIAGNOSIS — Z91040 Latex allergy status: Secondary | ICD-10-CM | POA: Diagnosis not present

## 2022-06-30 DIAGNOSIS — R042 Hemoptysis: Secondary | ICD-10-CM | POA: Diagnosis present

## 2022-06-30 DIAGNOSIS — Z83438 Family history of other disorder of lipoprotein metabolism and other lipidemia: Secondary | ICD-10-CM | POA: Diagnosis not present

## 2022-06-30 DIAGNOSIS — Z880 Allergy status to penicillin: Secondary | ICD-10-CM | POA: Diagnosis not present

## 2022-06-30 DIAGNOSIS — G43909 Migraine, unspecified, not intractable, without status migrainosus: Secondary | ICD-10-CM | POA: Diagnosis present

## 2022-06-30 DIAGNOSIS — R000 Tachycardia, unspecified: Secondary | ICD-10-CM | POA: Diagnosis present

## 2022-06-30 DIAGNOSIS — Z79899 Other long term (current) drug therapy: Secondary | ICD-10-CM | POA: Diagnosis not present

## 2022-06-30 DIAGNOSIS — T39395A Adverse effect of other nonsteroidal anti-inflammatory drugs [NSAID], initial encounter: Secondary | ICD-10-CM | POA: Diagnosis present

## 2022-06-30 DIAGNOSIS — K226 Gastro-esophageal laceration-hemorrhage syndrome: Secondary | ICD-10-CM | POA: Diagnosis present

## 2022-06-30 DIAGNOSIS — K2101 Gastro-esophageal reflux disease with esophagitis, with bleeding: Secondary | ICD-10-CM | POA: Diagnosis present

## 2022-06-30 DIAGNOSIS — Z791 Long term (current) use of non-steroidal anti-inflammatories (NSAID): Secondary | ICD-10-CM | POA: Diagnosis not present

## 2022-06-30 DIAGNOSIS — D62 Acute posthemorrhagic anemia: Secondary | ICD-10-CM | POA: Diagnosis present

## 2022-06-30 DIAGNOSIS — K922 Gastrointestinal hemorrhage, unspecified: Secondary | ICD-10-CM | POA: Diagnosis present

## 2022-06-30 DIAGNOSIS — K921 Melena: Secondary | ICD-10-CM | POA: Diagnosis present

## 2022-06-30 DIAGNOSIS — Z818 Family history of other mental and behavioral disorders: Secondary | ICD-10-CM | POA: Diagnosis not present

## 2022-06-30 HISTORY — PX: BIOPSY: SHX5522

## 2022-06-30 HISTORY — PX: ESOPHAGOGASTRODUODENOSCOPY (EGD) WITH PROPOFOL: SHX5813

## 2022-06-30 LAB — CBC WITH DIFFERENTIAL/PLATELET
Abs Immature Granulocytes: 0.03 10*3/uL (ref 0.00–0.07)
Basophils Absolute: 0 10*3/uL (ref 0.0–0.1)
Basophils Relative: 0 %
Eosinophils Absolute: 0.2 10*3/uL (ref 0.0–0.5)
Eosinophils Relative: 3 %
HCT: 31.3 % — ABNORMAL LOW (ref 39.0–52.0)
Hemoglobin: 10.4 g/dL — ABNORMAL LOW (ref 13.0–17.0)
Immature Granulocytes: 0 %
Lymphocytes Relative: 30 %
Lymphs Abs: 2.3 10*3/uL (ref 0.7–4.0)
MCH: 29.3 pg (ref 26.0–34.0)
MCHC: 33.2 g/dL (ref 30.0–36.0)
MCV: 88.2 fL (ref 80.0–100.0)
Monocytes Absolute: 0.6 10*3/uL (ref 0.1–1.0)
Monocytes Relative: 9 %
Neutro Abs: 4.4 10*3/uL (ref 1.7–7.7)
Neutrophils Relative %: 58 %
Platelets: 213 10*3/uL (ref 150–400)
RBC: 3.55 MIL/uL — ABNORMAL LOW (ref 4.22–5.81)
RDW: 12.9 % (ref 11.5–15.5)
WBC: 7.6 10*3/uL (ref 4.0–10.5)
nRBC: 0 % (ref 0.0–0.2)

## 2022-06-30 LAB — COMPREHENSIVE METABOLIC PANEL
ALT: 18 U/L (ref 0–44)
AST: 15 U/L (ref 15–41)
Albumin: 3.5 g/dL (ref 3.5–5.0)
Alkaline Phosphatase: 58 U/L (ref 38–126)
Anion gap: 6 (ref 5–15)
BUN: 15 mg/dL (ref 6–20)
CO2: 24 mmol/L (ref 22–32)
Calcium: 8.7 mg/dL — ABNORMAL LOW (ref 8.9–10.3)
Chloride: 107 mmol/L (ref 98–111)
Creatinine, Ser: 1.07 mg/dL (ref 0.61–1.24)
GFR, Estimated: >60 mL/min (ref >60)
Glucose, Bld: 101 mg/dL — ABNORMAL HIGH (ref 70–99)
Potassium: 4.1 mmol/L (ref 3.5–5.1)
Sodium: 137 mmol/L (ref 135–145)
Total Bilirubin: 1.4 mg/dL — ABNORMAL HIGH (ref 0.3–1.2)
Total Protein: 6.1 g/dL — ABNORMAL LOW (ref 6.5–8.1)

## 2022-06-30 LAB — HIV ANTIBODY (ROUTINE TESTING W REFLEX): HIV Screen 4th Generation wRfx: NONREACTIVE

## 2022-06-30 LAB — HEMOGLOBIN AND HEMATOCRIT, BLOOD
HCT: 31.5 % — ABNORMAL LOW (ref 39.0–52.0)
Hemoglobin: 10.3 g/dL — ABNORMAL LOW (ref 13.0–17.0)

## 2022-06-30 LAB — PHOSPHORUS: Phosphorus: 3.8 mg/dL (ref 2.5–4.6)

## 2022-06-30 LAB — MAGNESIUM: Magnesium: 2.1 mg/dL (ref 1.7–2.4)

## 2022-06-30 SURGERY — ESOPHAGOGASTRODUODENOSCOPY (EGD) WITH PROPOFOL
Anesthesia: Monitor Anesthesia Care

## 2022-06-30 MED ORDER — PROPOFOL 1000 MG/100ML IV EMUL
INTRAVENOUS | Status: AC
  Filled 2022-06-30: qty 100

## 2022-06-30 MED ORDER — PROPOFOL 500 MG/50ML IV EMUL
INTRAVENOUS | Status: DC | PRN
  Administered 2022-06-30: 150 ug/kg/min via INTRAVENOUS

## 2022-06-30 MED ORDER — PROPOFOL 10 MG/ML IV BOLUS
INTRAVENOUS | Status: DC | PRN
  Administered 2022-06-30: 10 mg via INTRAVENOUS
  Administered 2022-06-30 (×2): 20 mg via INTRAVENOUS
  Administered 2022-06-30: 30 mg via INTRAVENOUS

## 2022-06-30 MED ORDER — SODIUM CHLORIDE 0.9 % IV SOLN: INTRAVENOUS | Status: DC

## 2022-06-30 MED ORDER — LACTATED RINGERS IV SOLN: INTRAVENOUS | Status: DC

## 2022-06-30 SURGICAL SUPPLY — 15 items

## 2022-06-30 NOTE — Transfer of Care (Signed)
Immediate Anesthesia Transfer of Care Note  Patient: Walter Grima  Procedure(s) Performed: ESOPHAGOGASTRODUODENOSCOPY (EGD) WITH PROPOFOL BIOPSY  Patient Location: PACU and Endoscopy Unit  Anesthesia Type:MAC  Level of Consciousness: awake, alert  and patient cooperative  Airway & Oxygen Therapy: Patient Spontanous Breathing and Patient connected to face mask oxygen  Post-op Assessment: Report given to RN and Post -op Vital signs reviewed and stable  Post vital signs: Reviewed and stable  Last Vitals:  Vitals Value Taken Time  BP 125/66 06/30/22 1627  Temp 36.6 C 06/30/22 1627  Pulse 99 06/30/22 1627  Resp 18 06/30/22 1627  SpO2 99 % 06/30/22 1627  Vitals shown include unvalidated device data.  Last Pain:  Vitals:   06/30/22 1627  TempSrc: Tympanic  PainSc: Asleep         Complications: No notable events documented.

## 2022-06-30 NOTE — Op Note (Signed)
[facility] Patient Name: Jerome Young Procedure Date: 06/30/2022 MRN: [id number] Attending MD: Lear Ng , MD, [id number] Date of Birth: November 17, 1980 CSN: [id number] Age: 41 Admit Type: Inpatient Procedure:                Upper GI endoscopy Indications:              Hematemesis, Melena Providers:                Lear Ng, MD, Orvil Feil, RN, [facility] Technician, Technician, Brien Mates, RNFA Referring MD:             hospital team Medicines:                Propofol per Anesthesia, Monitored Anesthesia Care Complications:            No immediate complications. Estimated Blood Loss:     Estimated blood loss was minimal. Procedure:                Pre-Anesthesia Assessment:                           - Prior to the procedure, a History and Physical                            was performed, and patient medications and                            allergies were reviewed. The patient's tolerance of                            previous anesthesia was also reviewed. The risks                            and benefits of the procedure and the sedation                            options and risks were discussed with the patient.                            All questions were answered, and informed consent                            was obtained. Prior Anticoagulants: The patient has                            taken no anticoagulant or antiplatelet agents. ASA                            Grade Assessment: II - A patient with mild systemic                            disease. After reviewing the risks and benefits,  the patient was deemed in satisfactory condition to                            undergo the procedure.                           After obtaining informed consent, the endoscope was                            passed under direct vision. Throughout the                            procedure, the  patient's blood pressure, pulse, and                            oxygen saturations were monitored continuously. The                            [id number]-H190 ([id number]) Olympus endoscope was introduced                            through the mouth, and advanced to the second part                            of duodenum. The upper GI endoscopy was                            accomplished without difficulty. The patient                            tolerated the procedure well. Scope In: Scope Out: Findings:      A small non-bleeding Mallory-Weiss tear with stigmata of recent bleeding       was found.      The Z-line was regular and was found 40 cm from the incisors.      Segmental mild inflammation characterized by congestion (edema) and       erythema was found in the gastric antrum. Biopsies were taken with a       cold forceps for histology. Estimated blood loss was minimal.      The cardia and gastric fundus were normal on retroflexion.      Patchy moderate mucosal changes characterized by congestion, erythema       and erosion were found in the duodenal bulb.      The exam of the duodenum was otherwise normal.      LA Grade B (one or more mucosal breaks greater than 5 mm, not extending       between the tops of two mucosal folds) esophagitis was found at the       gastroesophageal junction. Impression:               - Mallory-Weiss tear.                           - Z-line regular, 40 cm from the incisors.                           -  Acute gastritis. Biopsied.                           - Mucosal changes in the duodenum.                           - LA Grade B acute esophagitis. Moderate Sedation:      N/A - MAC procedure Recommendation:           - Clear liquid diet.                           - Observe patient's clinical course.                           - Await pathology results. Procedure Code(s):        --- Professional ---                           [phone], Esophagogastroduodenoscopy,  flexible,                            transoral; with biopsy, single or multiple Diagnosis Code(s):        --- Professional ---                           K92.1, Melena (includes Hematochezia)                           K22.6, Gastro-esophageal laceration-hemorrhage                            syndrome                           K92.0, Hematemesis                           K20.90, Esophagitis, unspecified without bleeding                           K29.00, Acute gastritis without bleeding                           K31.89, Other diseases of stomach and duodenum CPT copyright 2022 [employer]. All rights reserved. The codes documented in this report are preliminary and upon coder review may  be revised to meet current compliance requirements. Lear Ng, MD 06/30/2022 4:31:24 PM This report has been signed electronically. Number of Addenda: 0

## 2022-06-30 NOTE — Consult Note (Signed)
Referring Provider: Dr. [place] Primary Care Physician:  Loman Brooklyn, FNP Primary Gastroenterologist:  Althia Forts  Reason for Consultation:  Melena  HPI: Jerome Young is a 41 y.o. male with acute onset of regurgitating small amount of red fluid Monday evening and then having 3 episodes of black stools yesterday without any further regurgitation. Denies vomiting up the blood. Felt dizzy and lightheaded Monday evening after the red spit episode. Denies associated abdominal pain. Has taken NSAIDs daily for years in the form of Goody's, BC powders, and/or Ibuprofen for chronic headaches. Rare alcohol. No known history of ulcers. 3 g Hgb drop in one day after going to the ER Monday and Tuesday. Transferred from [place] yesterday.  Past Medical History:  Diagnosis Date   Migraines     History reviewed. No pertinent surgical history.  Prior to Admission medications   Medication Sig Start Date End Date Taking? Authorizing Provider  Aspirin-Acetaminophen-Caffeine (GOODYS EXTRA STRENGTH PO) Take by mouth.   Yes [provider]    Scheduled Meds:  pantoprazole (PROTONIX) IV  40 mg Intravenous Q12H   Continuous Infusions:  sodium chloride 100 mL/hr at 06/30/22 0741   PRN Meds:.acetaminophen, melatonin, prochlorperazine  Allergies as of 06/29/2022 - Review Complete 06/29/2022  Allergen Reaction Noted   Naproxen Palpitations 02/09/2022   Penicillins Other (See Comments) 10/11/2019   Latex Rash 06/29/2022    Family History  Problem Relation Age of Onset   Anxiety disorder Mother    Depression Mother    Anxiety disorder Sister    Depression Sister    Hypertension Maternal Grandmother    Hyperlipidemia Maternal Grandmother    Hypertension Maternal Grandfather     Social History   Socioeconomic History   Marital status: Married    Spouse name: Not on file   Number of children: Not on file   Years of education: Not on file   Highest education level: Not on file   Occupational History   Not on file  Tobacco Use   Smoking status: Never   Smokeless tobacco: Never  Vaping Use   Vaping Use: Never used  Substance and Sexual Activity   Alcohol use: Not Currently    Comment: socially   Drug use: Never   Sexual activity: Yes    Birth control/protection: Surgical  Other Topics Concern   Not on file  Social History Narrative   Not on file   Social Determinants of Health   Financial Resource Strain: Not on file  Food Insecurity: Not on file  Transportation Needs: Not on file  Physical Activity: Not on file  Stress: Not on file  Social Connections: Not on file  Intimate Partner Violence: Not on file    Review of Systems: All negative except as stated above in HPI.  Physical Exam: Vital signs: Vitals:   06/30/22 0400 06/30/22 0741  BP: 125/75 122/78  Pulse: 94 90  Resp: 20 18  Temp: 97.7 F (36.5 C) 98.1 F (36.7 C)  SpO2: 100% 99%   Last BM Date : 06/29/22 General:   Lethargic, Well-developed, well-nourished, pleasant and cooperative in NAD Head: normocephalic, atraumatic Eyes: anicteric sclera ENT: oropharynx clear Neck: supple, nontender Lungs:  Clear throughout to auscultation.   No wheezes, crackles, or rhonchi. No acute distress. Heart:  Regular rate and rhythm; no murmurs, clicks, rubs,  or gallops. Abdomen: soft, nontender, nondistended, +BS  Rectal:  Deferred Ext: no edema  GI:  Lab Results: Recent Labs    06/28/22 2026 06/29/22 1147 06/29/22 2143  06/30/22 0512 06/30/22 0912  WBC 14.0*  --   --  7.6  --   HGB 13.1   < > 11.2* 10.4* 10.3*  HCT 40.6   < > 33.5* 31.3* 31.5*  PLT 308  --   --  213  --    < > = values in this interval not displayed.   BMET Recent Labs    06/28/22 2026 06/30/22 0512  NA 138 137  K 4.0 4.1  CL 104 107  CO2 23 24  GLUCOSE 123* 101*  BUN 31* 15  CREATININE 1.08 1.07  CALCIUM 9.0 8.7*   LFT Recent Labs    06/30/22 0512  PROT 6.1*  ALBUMIN 3.5  AST 15  ALT 18   ALKPHOS 58  BILITOT 1.4*   PT/INR No results for input(s): "LABPROT", "INR" in the last 72 hours.   Studies/Results: CT Angio Chest PE W and/or Wo Contrast  Result Date: 06/28/2022 CLINICAL DATA:  Hemoptysis EXAM: CT ANGIOGRAPHY CHEST CT ABDOMEN AND PELVIS WITH CONTRAST TECHNIQUE: Multidetector CT imaging of the chest was performed using the standard protocol during bolus administration of intravenous contrast. Multiplanar CT image reconstructions and MIPs were obtained to evaluate the vascular anatomy. Multidetector CT imaging of the abdomen and pelvis was performed using the standard protocol during bolus administration of intravenous contrast. RADIATION DOSE REDUCTION: This exam was performed according to the departmental dose-optimization program which includes automated exposure control, adjustment of the mA and/or kV according to patient size and/or use of iterative reconstruction technique. CONTRAST:  156mL OMNIPAQUE IOHEXOL 350 MG/ML SOLN COMPARISON:  Chest x-ray from earlier in the same day. FINDINGS: CTA CHEST FINDINGS Cardiovascular: Thoracic aorta and its branches are within normal limits. No aneurysmal dilatation or dissection is seen. No cardiac enlargement is noted. No coronary calcifications are seen. The pulmonary artery shows a normal branching pattern bilaterally. No filling defect to suggest pulmonary embolism is seen. Mediastinum/Nodes: Thoracic inlet is within normal limits. No sizable hilar or mediastinal adenopathy is noted. The esophagus as visualized is within normal limits. Lungs/Pleura: The lungs are well aerated bilaterally. No focal infiltrate or effusion is seen. No sizable parenchymal nodule is noted. Musculoskeletal: No acute rib abnormality is noted. No compression deformity is seen. Review of the MIP images confirms the above findings. CT ABDOMEN and PELVIS FINDINGS Hepatobiliary: Fatty infiltration of the liver is noted. The gallbladder is within normal limits.  Pancreas: Unremarkable. No pancreatic ductal dilatation or surrounding inflammatory changes. Spleen: Normal in size without focal abnormality. Adrenals/Urinary Tract: Adrenal glands are within normal limits. Kidneys demonstrate a normal enhancement pattern bilaterally. No renal calculi or obstructive changes are seen. The ureters are within normal limits. Bladder is well distended. Stomach/Bowel: No obstructive or inflammatory changes of the colon are seen. The appendix is within normal limits. Small bowel and stomach are unremarkable. Vascular/Lymphatic: No significant vascular findings are present. No enlarged abdominal or pelvic lymph nodes. Reproductive: Prostate is unremarkable. Other: No abdominal wall hernia or abnormality. No abdominopelvic ascites. Musculoskeletal: No acute or significant osseous findings. Review of the MIP images confirms the above findings. IMPRESSION: CTA of the chest: No evidence of pulmonary embolism. No acute abnormality noted. CT of the abdomen and pelvis: Fatty liver. No other focal abnormality is noted. Electronically Signed   By: Inez Catalina M.D.   On: 06/28/2022 22:14   CT ABDOMEN PELVIS W CONTRAST  Result Date: 06/28/2022 CLINICAL DATA:  Hemoptysis EXAM: CT ANGIOGRAPHY CHEST CT ABDOMEN AND PELVIS WITH CONTRAST TECHNIQUE: Multidetector  CT imaging of the chest was performed using the standard protocol during bolus administration of intravenous contrast. Multiplanar CT image reconstructions and MIPs were obtained to evaluate the vascular anatomy. Multidetector CT imaging of the abdomen and pelvis was performed using the standard protocol during bolus administration of intravenous contrast. RADIATION DOSE REDUCTION: This exam was performed according to the departmental dose-optimization program which includes automated exposure control, adjustment of the mA and/or kV according to patient size and/or use of iterative reconstruction technique. CONTRAST:  142mL OMNIPAQUE IOHEXOL  350 MG/ML SOLN COMPARISON:  Chest x-ray from earlier in the same day. FINDINGS: CTA CHEST FINDINGS Cardiovascular: Thoracic aorta and its branches are within normal limits. No aneurysmal dilatation or dissection is seen. No cardiac enlargement is noted. No coronary calcifications are seen. The pulmonary artery shows a normal branching pattern bilaterally. No filling defect to suggest pulmonary embolism is seen. Mediastinum/Nodes: Thoracic inlet is within normal limits. No sizable hilar or mediastinal adenopathy is noted. The esophagus as visualized is within normal limits. Lungs/Pleura: The lungs are well aerated bilaterally. No focal infiltrate or effusion is seen. No sizable parenchymal nodule is noted. Musculoskeletal: No acute rib abnormality is noted. No compression deformity is seen. Review of the MIP images confirms the above findings. CT ABDOMEN and PELVIS FINDINGS Hepatobiliary: Fatty infiltration of the liver is noted. The gallbladder is within normal limits. Pancreas: Unremarkable. No pancreatic ductal dilatation or surrounding inflammatory changes. Spleen: Normal in size without focal abnormality. Adrenals/Urinary Tract: Adrenal glands are within normal limits. Kidneys demonstrate a normal enhancement pattern bilaterally. No renal calculi or obstructive changes are seen. The ureters are within normal limits. Bladder is well distended. Stomach/Bowel: No obstructive or inflammatory changes of the colon are seen. The appendix is within normal limits. Small bowel and stomach are unremarkable. Vascular/Lymphatic: No significant vascular findings are present. No enlarged abdominal or pelvic lymph nodes. Reproductive: Prostate is unremarkable. Other: No abdominal wall hernia or abnormality. No abdominopelvic ascites. Musculoskeletal: No acute or significant osseous findings. Review of the MIP images confirms the above findings. IMPRESSION: CTA of the chest: No evidence of pulmonary embolism. No acute  abnormality noted. CT of the abdomen and pelvis: Fatty liver. No other focal abnormality is noted. Electronically Signed   By: Inez Catalina M.D.   On: 06/28/2022 22:14   DG Chest Portable 1 View  Result Date: 06/28/2022 CLINICAL DATA:  Hemoptysis. EXAM: PORTABLE CHEST 1 VIEW COMPARISON:  None Available. FINDINGS: The heart size and mediastinal contours are within normal limits. No consolidation, effusion, or pneumothorax. No acute osseous abnormality. IMPRESSION: No active disease. Electronically Signed   By: Brett Fairy M.D.   On: 06/28/2022 20:47    Impression/Plan: Melenic stools in setting of chronic NSAID use concerning for peptic ulcer disease. EGD today to further evaluate. NPO for EGD today. IV PPI Q 12 hours.    LOS: 0 days   Lear Ng  06/30/2022, 1:22 PM  Questions please call [phone]

## 2022-06-30 NOTE — Progress Notes (Signed)
PROGRESS NOTE    [name]  TUU:[id number] DOB: 03/24/1981 DOA: 06/29/2022 PCP: Gwenlyn Fudge, FNP    Brief Narrative:   Jerome Young is a 41 y.o. male with past medical history significant for migraine headaches with frequent use of NSAIDs (ibuprofen, Aleve, Goody powder) who presented to [facility] ED with reports of black stool.  Was seen recently in the ED for spitting up blood; but was ultimately discharged home after her extensive work-up with CT abdomen/pelvis and CTA chest that were unrevealing except for fatty liver.  In the ED, temperature 98.8 F, HR 130, RR 16, BP 144/99, SPO2 100% on room air.  Hemoglobin 10.6, FOBT positive.  EDP started on Protonix 40 mg IV x1, 1 L NS bolus.  EDP consulted [facility] gastroenterology.  Patient was transferred to [facility] under the hospital service for concern of upper GI bleed likely secondary to NSAID abuse.  Assessment & Plan:   Upper GI bleed, likely NSAID induced Acute blood loss anemia Patient presenting to ED with dark tarry stools associated with hematemesis.  Patient's hemoglobin 13.1 on 10/23 now down to 10.4.  Reports NSAID abuse with ibuprofen, Aleve and Goody powders due to his frequent headaches.  [facility] was consulted and underwent EGD on 10/25 with small nonbleeding Mallory-Weiss tear with stigmata of recent bleeding, inflammation/erythema gastric antrum s/p biopsy, mucosal changes erythema and erosion duodenal bulb, LA grade B esophagitis at the GE junction. -- [facility] following, appreciate assistance -- Hgb 13.1>10.6>11.2>10.4>10.3 -- Protonix 40 mg IV q12h -- Clear liquid diet -- Discussed cessation from NSAIDs -- Pathology pending -- Transfuse for hemoglobin less than 7.0 rectal bleeding -- CBC in a.m.  Tachycardia Patient presenting with tachycardia, likely secondary to acute blood loss anemia with 3 g drop in hemoglobin. -- Continue monitor CBC daily  DVT prophylaxis: SCDs  Start: 06/29/22 2058    Code Status: Full Code Family Communication:   Disposition Plan:  Level of care: Med-Surg Status is: Inpatient Remains inpatient appropriate because: EGD today, pending further GI recommendations, will need diet advancement before stable for discharge home    Consultants:  [facility]ology, Dr. Bosie Clos  Procedures:  EGD 10/25  Antimicrobials:  None   Subjective: Patient seen and examined at bedside, resting comfortably.  Awaiting EGD later this afternoon.  No further bowel movement since transfer to [facility].  Denies any further hematemesis.  Discussed NSAID cessation.  Updated family present at bedside.  No other complaints or concerns at this time.  Denies headache, no dizziness, no chest pain, palpitations, no shortness of breath, no abdominal pain, no focal weakness, no fatigue, no fever/chills/night sweats, no current nausea/vomiting, no diarrhea, no paresthesias.  No acute events overnight per nursing staff.  Objective: Vitals:   06/30/22 0741 06/30/22 1356 06/30/22 1449 06/30/22 1627  BP: 122/78 128/87 (!) 143/76 125/66  Pulse: 90 86 88 (!) 104  Resp: 18 16 19 20   Temp: 98.1 F (36.7 C) 98.1 F (36.7 C) 97.9 F (36.6 C) 97.9 F (36.6 C)  TempSrc: Oral Oral Oral Tympanic  SpO2: 99% 100% 100% 99%  Weight:      Height:        Intake/Output Summary (Last 24 hours) at 06/30/2022 1627 Last data filed at 06/30/2022 1621 Gross per 24 hour  Intake 200 ml  Output --  Net 200 ml   Filed Weights   06/29/22 0929 06/29/22 1954  Weight: 95.3 kg 94.2 kg    Examination:  Physical Exam: GEN:  NAD, alert and oriented x 3, wd/wn HEENT: NCAT, PERRL, EOMI, sclera clear, MMM PULM: CTAB w/o wheezes/crackles, normal respiratory effort, on room air CV: RRR w/o M/G/R GI: abd soft, NTND, NABS, no R/G/M MSK: no peripheral edema, muscle strength globally intact 5/5 bilateral upper/lower extremities NEURO: CN II-XII intact, no focal deficits,  sensation to light touch intact PSYCH: normal mood/affect Integumentary: dry/intact, no rashes or wounds    Data Reviewed: I have personally reviewed following labs and imaging studies  CBC: Recent Labs  Lab 06/28/22 2026 06/29/22 1147 06/29/22 2143 06/30/22 0512 06/30/22 0912  WBC 14.0*  --   --  7.6  --   NEUTROABS 8.0*  --   --  4.4  --   HGB 13.1 10.6* 11.2* 10.4* 10.3*  HCT 40.6 31.7* 33.5* 31.3* 31.5*  MCV 87.5  --   --  88.2  --   PLT 308  --   --  213  --    Basic Metabolic Panel: Recent Labs  Lab 06/28/22 2026 06/30/22 0512  NA 138 137  K 4.0 4.1  CL 104 107  CO2 23 24  GLUCOSE 123* 101*  BUN 31* 15  CREATININE 1.08 1.07  CALCIUM 9.0 8.7*  MG  --  2.1  PHOS  --  3.8   GFR: Estimated Creatinine Clearance: 107.6 mL/min (by C-G formula based on SCr of 1.07 mg/dL). Liver Function Tests: Recent Labs  Lab 06/28/22 2026 06/30/22 0512  AST 14* 15  ALT 17 18  ALKPHOS 72 58  BILITOT 0.6 1.4*  PROT 7.2 6.1*  ALBUMIN 4.2 3.5   Recent Labs  Lab 06/28/22 2026  LIPASE 13   No results for input(s): "AMMONIA" in the last 168 hours. Coagulation Profile: No results for input(s): "INR", "PROTIME" in the last 168 hours. Cardiac Enzymes: No results for input(s): "CKTOTAL", "CKMB", "CKMBINDEX", "TROPONINI" in the last 168 hours. BNP (last 3 results) No results for input(s): "PROBNP" in the last 8760 hours. HbA1C: No results for input(s): "HGBA1C" in the last 72 hours. CBG: No results for input(s): "GLUCAP" in the last 168 hours. Lipid Profile: No results for input(s): "CHOL", "HDL", "LDLCALC", "TRIG", "CHOLHDL", "LDLDIRECT" in the last 72 hours. Thyroid Function Tests: No results for input(s): "TSH", "T4TOTAL", "FREET4", "T3FREE", "THYROIDAB" in the last 72 hours. Anemia Panel: No results for input(s): "VITAMINB12", "FOLATE", "FERRITIN", "TIBC", "IRON", "RETICCTPCT" in the last 72 hours. Sepsis Labs: No results for input(s): "PROCALCITON", "LATICACIDVEN"  in the last 168 hours.  No results found for this or any previous visit (from the past 240 hour(s)).       Radiology Studies: CT Angio Chest PE W and/or Wo Contrast  Result Date: 06/28/2022 CLINICAL DATA:  Hemoptysis EXAM: CT ANGIOGRAPHY CHEST CT ABDOMEN AND PELVIS WITH CONTRAST TECHNIQUE: Multidetector CT imaging of the chest was performed using the standard protocol during bolus administration of intravenous contrast. Multiplanar CT image reconstructions and MIPs were obtained to evaluate the vascular anatomy. Multidetector CT imaging of the abdomen and pelvis was performed using the standard protocol during bolus administration of intravenous contrast. RADIATION DOSE REDUCTION: This exam was performed according to the departmental dose-optimization program which includes automated exposure control, adjustment of the mA and/or kV according to patient size and/or use of iterative reconstruction technique. CONTRAST:  OMNIPAQUE IOHEXOL 350 MG/ML SOLN COMPARISON:  Chest x-ray from earlier in the same day. FINDINGS: CTA CHEST FINDINGS Cardiovascular: Thoracic aorta and its branches are within normal limits. No aneurysmal dilatation or dissection is seen.  No cardiac enlargement is noted. No coronary calcifications are seen. The pulmonary artery shows a normal branching pattern bilaterally. No filling defect to suggest pulmonary embolism is seen. Mediastinum/Nodes: Thoracic inlet is within normal limits. No sizable hilar or mediastinal adenopathy is noted. The esophagus as visualized is within normal limits. Lungs/Pleura: The lungs are well aerated bilaterally. No focal infiltrate or effusion is seen. No sizable parenchymal nodule is noted. Musculoskeletal: No acute rib abnormality is noted. No compression deformity is seen. Review of the MIP images confirms the above findings. CT ABDOMEN and PELVIS FINDINGS Hepatobiliary: Fatty infiltration of the liver is noted. The gallbladder is within normal  limits. Pancreas: Unremarkable. No pancreatic ductal dilatation or surrounding inflammatory changes. Spleen: Normal in size without focal abnormality. Adrenals/Urinary Tract: Adrenal glands are within normal limits. Kidneys demonstrate a normal enhancement pattern bilaterally. No renal calculi or obstructive changes are seen. The ureters are within normal limits. Bladder is well distended. Stomach/Bowel: No obstructive or inflammatory changes of the colon are seen. The appendix is within normal limits. Small bowel and stomach are unremarkable. Vascular/Lymphatic: No significant vascular findings are present. No enlarged abdominal or pelvic lymph nodes. Reproductive: Prostate is unremarkable. Other: No abdominal wall hernia or abnormality. No abdominopelvic ascites. Musculoskeletal: No acute or significant osseous findings. Review of the MIP images confirms the above findings. IMPRESSION: CTA of the chest: No evidence of pulmonary embolism. No acute abnormality noted. CT of the abdomen and pelvis: Fatty liver. No other focal abnormality is noted. Electronically Signed   By: Inez Catalina M.D.   On: 06/28/2022 22:14   CT ABDOMEN PELVIS W CONTRAST  Result Date: 06/28/2022 CLINICAL DATA:  Hemoptysis EXAM: CT ANGIOGRAPHY CHEST CT ABDOMEN AND PELVIS WITH CONTRAST TECHNIQUE: Multidetector CT imaging of the chest was performed using the standard protocol during bolus administration of intravenous contrast. Multiplanar CT image reconstructions and MIPs were obtained to evaluate the vascular anatomy. Multidetector CT imaging of the abdomen and pelvis was performed using the standard protocol during bolus administration of intravenous contrast. RADIATION DOSE REDUCTION: This exam was performed according to the departmental dose-optimization program which includes automated exposure control, adjustment of the mA and/or kV according to patient size and/or use of iterative reconstruction technique. CONTRAST:  173mL OMNIPAQUE  IOHEXOL 350 MG/ML SOLN COMPARISON:  Chest x-ray from earlier in the same day. FINDINGS: CTA CHEST FINDINGS Cardiovascular: Thoracic aorta and its branches are within normal limits. No aneurysmal dilatation or dissection is seen. No cardiac enlargement is noted. No coronary calcifications are seen. The pulmonary artery shows a normal branching pattern bilaterally. No filling defect to suggest pulmonary embolism is seen. Mediastinum/Nodes: Thoracic inlet is within normal limits. No sizable hilar or mediastinal adenopathy is noted. The esophagus as visualized is within normal limits. Lungs/Pleura: The lungs are well aerated bilaterally. No focal infiltrate or effusion is seen. No sizable parenchymal nodule is noted. Musculoskeletal: No acute rib abnormality is noted. No compression deformity is seen. Review of the MIP images confirms the above findings. CT ABDOMEN and PELVIS FINDINGS Hepatobiliary: Fatty infiltration of the liver is noted. The gallbladder is within normal limits. Pancreas: Unremarkable. No pancreatic ductal dilatation or surrounding inflammatory changes. Spleen: Normal in size without focal abnormality. Adrenals/Urinary Tract: Adrenal glands are within normal limits. Kidneys demonstrate a normal enhancement pattern bilaterally. No renal calculi or obstructive changes are seen. The ureters are within normal limits. Bladder is well distended. Stomach/Bowel: No obstructive or inflammatory changes of the colon are seen. The appendix is within normal limits.  Small bowel and stomach are unremarkable. Vascular/Lymphatic: No significant vascular findings are present. No enlarged abdominal or pelvic lymph nodes. Reproductive: Prostate is unremarkable. Other: No abdominal wall hernia or abnormality. No abdominopelvic ascites. Musculoskeletal: No acute or significant osseous findings. Review of the MIP images confirms the above findings. IMPRESSION: CTA of the chest: No evidence of pulmonary embolism. No acute  abnormality noted. CT of the abdomen and pelvis: Fatty liver. No other focal abnormality is noted. Electronically Signed   By: Alcide Clever M.D.   On: 06/28/2022 22:14   DG Chest Portable 1 View  Result Date: 06/28/2022 CLINICAL DATA:  Hemoptysis. EXAM: PORTABLE CHEST 1 VIEW COMPARISON:  None Available. FINDINGS: The heart size and mediastinal contours are within normal limits. No consolidation, effusion, or pneumothorax. No acute osseous abnormality. IMPRESSION: No active disease. Electronically Signed   By: Thornell Sartorius M.D.   On: 06/28/2022 20:47        Scheduled Meds:  [MAR Hold] pantoprazole (PROTONIX) IV  40 mg Intravenous Q12H   Continuous Infusions:  sodium chloride 100 mL/hr at 06/30/22 0741   sodium chloride     lactated ringers 10 mL/hr at 06/30/22 1451     LOS: 0 days    Time spent: 52 minutes spent on chart review, discussion with nursing staff, consultants, updating family and interview/physical exam; more than 50% of that time was spent in counseling and/or coordination of care.    Alvira Philips [place], DO [facility] Available via Epic secure chat 7am-7pm After these hours, please refer to coverage provider listed on [email or web address] 06/30/2022, 4:27 PM

## 2022-06-30 NOTE — Anesthesia Procedure Notes (Signed)
Procedure Name: MAC Date/Time: 06/30/2022 4:03 PM  Performed by: Niel Hummer, CRNAPre-anesthesia Checklist: Patient identified, Emergency Drugs available, Suction available and Patient being monitored Oxygen Delivery Method: Simple face mask

## 2022-06-30 NOTE — Anesthesia Preprocedure Evaluation (Addendum)
Anesthesia Evaluation  Patient identified by MRN, date of birth, ID band Patient awake    Reviewed: Allergy & Precautions, NPO status , Patient's Chart, lab work & pertinent test results  Airway Mallampati: II  TM Distance: >3 FB Neck ROM: Full    Dental no notable dental hx. (+) Dental Advisory Given, Teeth Intact   Pulmonary neg pulmonary ROS,    Pulmonary exam normal breath sounds clear to auscultation       Cardiovascular negative cardio ROS Normal cardiovascular exam Rhythm:Regular Rate:Normal     Neuro/Psych  Headaches,    GI/Hepatic negative GI ROS, Neg liver ROS,   Endo/Other  negative endocrine ROS  Renal/GU negative Renal ROS     Musculoskeletal negative musculoskeletal ROS (+)   Abdominal   Peds  Hematology negative hematology ROS (+)   Anesthesia Other Findings   Reproductive/Obstetrics                            Anesthesia Physical Anesthesia Plan  ASA: 2  Anesthesia Plan: MAC   Post-op Pain Management: Minimal or no pain anticipated   Induction: Intravenous  PONV Risk Score and Plan: 1 and Propofol infusion, TIVA, Treatment may vary due to age or medical condition and Ondansetron  Airway Management Planned:   Additional Equipment:   Intra-op Plan:   Post-operative Plan:   Informed Consent: I have reviewed the patients History and Physical, chart, labs and discussed the procedure including the risks, benefits and alternatives for the proposed anesthesia with the patient or authorized representative who has indicated his/her understanding and acceptance.     Dental advisory given  Plan Discussed with: CRNA  Anesthesia Plan Comments:         Anesthesia Quick Evaluation

## 2022-06-30 NOTE — Interval H&P Note (Signed)
History and Physical Interval Note:  06/30/2022 3:37 PM  Jerome Young  has presented today for surgery, with the diagnosis of Melena.  The various methods of treatment have been discussed with the patient and family. After consideration of risks, benefits and other options for treatment, the patient has consented to  Procedure(s): ESOPHAGOGASTRODUODENOSCOPY (EGD) WITH PROPOFOL (N/A) as a surgical intervention.  The patient's history has been reviewed, patient examined, no change in status, stable for surgery.  I have reviewed the patient's chart and labs.  Questions were answered to the patient's satisfaction.     Lear Ng

## 2022-06-30 NOTE — H&P (View-Only) (Signed)
Referring Provider: Dr. Austria Primary Care Physician:  Joyce, Britney F, FNP Primary Gastroenterologist:  Unassigned  Reason for Consultation:  Melena  HPI: Jerome Young is a 41 y.o. male with acute onset of regurgitating small amount of red fluid Monday evening and then having 3 episodes of black stools yesterday without any further regurgitation. Denies vomiting up the blood. Felt dizzy and lightheaded Monday evening after the red spit episode. Denies associated abdominal pain. Has taken NSAIDs daily for years in the form of Goody's, BC powders, and/or Ibuprofen for chronic headaches. Rare alcohol. No known history of ulcers. 3 g Hgb drop in one day after going to the ER Monday and Tuesday. Transferred from [facility] yesterday.  Past Medical History:  Diagnosis Date   Migraines     History reviewed. No pertinent surgical history.  Prior to Admission medications   Medication Sig Start Date End Date Taking? Authorizing Provider  Aspirin-Acetaminophen-Caffeine (GOODYS EXTRA STRENGTH PO) Take by mouth.   Yes [provider]    Scheduled Meds:  pantoprazole (PROTONIX) IV  40 mg Intravenous Q12H   Continuous Infusions:  sodium chloride 100 mL/hr at 06/30/22 0741   PRN Meds:.acetaminophen, melatonin, prochlorperazine  Allergies as of 06/29/2022 - Review Complete 06/29/2022  Allergen Reaction Noted   Naproxen Palpitations 02/09/2022   Penicillins Other (See Comments) 10/11/2019   Latex Rash 06/29/2022    Family History  Problem Relation Age of Onset   Anxiety disorder Mother    Depression Mother    Anxiety disorder Sister    Depression Sister    Hypertension Maternal Grandmother    Hyperlipidemia Maternal Grandmother    Hypertension Maternal Grandfather     Social History   Socioeconomic History   Marital status: Married    Spouse name: Not on file   Number of children: Not on file   Years of education: Not on file   Highest education level: Not on file   Occupational History   Not on file  Tobacco Use   Smoking status: Never   Smokeless tobacco: Never  Vaping Use   Vaping Use: Never used  Substance and Sexual Activity   Alcohol use: Not Currently    Comment: socially   Drug use: Never   Sexual activity: Yes    Birth control/protection: Surgical  Other Topics Concern   Not on file  Social History Narrative   Not on file   Social Determinants of Health   Financial Resource Strain: Not on file  Food Insecurity: Not on file  Transportation Needs: Not on file  Physical Activity: Not on file  Stress: Not on file  Social Connections: Not on file  Intimate Partner Violence: Not on file    Review of Systems: All negative except as stated above in HPI.  Physical Exam: Vital signs: Vitals:   06/30/22 0400 06/30/22 0741  BP: 125/75 122/78  Pulse: 94 90  Resp: 20 18  Temp: 97.7 F (36.5 C) 98.1 F (36.7 C)  SpO2: 100% 99%   Last BM Date : 06/29/22 General:   Lethargic, Well-developed, well-nourished, pleasant and cooperative in NAD Head: normocephalic, atraumatic Eyes: anicteric sclera ENT: oropharynx clear Neck: supple, nontender Lungs:  Clear throughout to auscultation.   No wheezes, crackles, or rhonchi. No acute distress. Heart:  Regular rate and rhythm; no murmurs, clicks, rubs,  or gallops. Abdomen: soft, nontender, nondistended, +BS  Rectal:  Deferred Ext: no edema  GI:  Lab Results: Recent Labs    06/28/22 2026 06/29/22 1147 06/29/22 2143   06/30/22 0512 06/30/22 0912  WBC 14.0*  --   --  7.6  --   HGB 13.1   < > 11.2* 10.4* 10.3*  HCT 40.6   < > 33.5* 31.3* 31.5*  PLT 308  --   --  213  --    < > = values in this interval not displayed.   BMET Recent Labs    06/28/22 2026 06/30/22 0512  NA 138 137  K 4.0 4.1  CL 104 107  CO2 23 24  GLUCOSE 123* 101*  BUN 31* 15  CREATININE 1.08 1.07  CALCIUM 9.0 8.7*   LFT Recent Labs    06/30/22 0512  PROT 6.1*  ALBUMIN 3.5  AST 15  ALT 18   ALKPHOS 58  BILITOT 1.4*   PT/INR No results for input(s): "LABPROT", "INR" in the last 72 hours.   Studies/Results: CT Angio Chest PE W and/or Wo Contrast  Result Date: 06/28/2022 CLINICAL DATA:  Hemoptysis EXAM: CT ANGIOGRAPHY CHEST CT ABDOMEN AND PELVIS WITH CONTRAST TECHNIQUE: Multidetector CT imaging of the chest was performed using the standard protocol during bolus administration of intravenous contrast. Multiplanar CT image reconstructions and MIPs were obtained to evaluate the vascular anatomy. Multidetector CT imaging of the abdomen and pelvis was performed using the standard protocol during bolus administration of intravenous contrast. RADIATION DOSE REDUCTION: This exam was performed according to the departmental dose-optimization program which includes automated exposure control, adjustment of the mA and/or kV according to patient size and/or use of iterative reconstruction technique. CONTRAST:  156mL OMNIPAQUE IOHEXOL 350 MG/ML SOLN COMPARISON:  Chest x-ray from earlier in the same day. FINDINGS: CTA CHEST FINDINGS Cardiovascular: Thoracic aorta and its branches are within normal limits. No aneurysmal dilatation or dissection is seen. No cardiac enlargement is noted. No coronary calcifications are seen. The pulmonary artery shows a normal branching pattern bilaterally. No filling defect to suggest pulmonary embolism is seen. Mediastinum/Nodes: Thoracic inlet is within normal limits. No sizable hilar or mediastinal adenopathy is noted. The esophagus as visualized is within normal limits. Lungs/Pleura: The lungs are well aerated bilaterally. No focal infiltrate or effusion is seen. No sizable parenchymal nodule is noted. Musculoskeletal: No acute rib abnormality is noted. No compression deformity is seen. Review of the MIP images confirms the above findings. CT ABDOMEN and PELVIS FINDINGS Hepatobiliary: Fatty infiltration of the liver is noted. The gallbladder is within normal limits.  Pancreas: Unremarkable. No pancreatic ductal dilatation or surrounding inflammatory changes. Spleen: Normal in size without focal abnormality. Adrenals/Urinary Tract: Adrenal glands are within normal limits. Kidneys demonstrate a normal enhancement pattern bilaterally. No renal calculi or obstructive changes are seen. The ureters are within normal limits. Bladder is well distended. Stomach/Bowel: No obstructive or inflammatory changes of the colon are seen. The appendix is within normal limits. Small bowel and stomach are unremarkable. Vascular/Lymphatic: No significant vascular findings are present. No enlarged abdominal or pelvic lymph nodes. Reproductive: Prostate is unremarkable. Other: No abdominal wall hernia or abnormality. No abdominopelvic ascites. Musculoskeletal: No acute or significant osseous findings. Review of the MIP images confirms the above findings. IMPRESSION: CTA of the chest: No evidence of pulmonary embolism. No acute abnormality noted. CT of the abdomen and pelvis: Fatty liver. No other focal abnormality is noted. Electronically Signed   By: Inez Catalina M.D.   On: 06/28/2022 22:14   CT ABDOMEN PELVIS W CONTRAST  Result Date: 06/28/2022 CLINICAL DATA:  Hemoptysis EXAM: CT ANGIOGRAPHY CHEST CT ABDOMEN AND PELVIS WITH CONTRAST TECHNIQUE: Multidetector  CT imaging of the chest was performed using the standard protocol during bolus administration of intravenous contrast. Multiplanar CT image reconstructions and MIPs were obtained to evaluate the vascular anatomy. Multidetector CT imaging of the abdomen and pelvis was performed using the standard protocol during bolus administration of intravenous contrast. RADIATION DOSE REDUCTION: This exam was performed according to the departmental dose-optimization program which includes automated exposure control, adjustment of the mA and/or kV according to patient size and/or use of iterative reconstruction technique. CONTRAST:  100mL OMNIPAQUE IOHEXOL  350 MG/ML SOLN COMPARISON:  Chest x-ray from earlier in the same day. FINDINGS: CTA CHEST FINDINGS Cardiovascular: Thoracic aorta and its branches are within normal limits. No aneurysmal dilatation or dissection is seen. No cardiac enlargement is noted. No coronary calcifications are seen. The pulmonary artery shows a normal branching pattern bilaterally. No filling defect to suggest pulmonary embolism is seen. Mediastinum/Nodes: Thoracic inlet is within normal limits. No sizable hilar or mediastinal adenopathy is noted. The esophagus as visualized is within normal limits. Lungs/Pleura: The lungs are well aerated bilaterally. No focal infiltrate or effusion is seen. No sizable parenchymal nodule is noted. Musculoskeletal: No acute rib abnormality is noted. No compression deformity is seen. Review of the MIP images confirms the above findings. CT ABDOMEN and PELVIS FINDINGS Hepatobiliary: Fatty infiltration of the liver is noted. The gallbladder is within normal limits. Pancreas: Unremarkable. No pancreatic ductal dilatation or surrounding inflammatory changes. Spleen: Normal in size without focal abnormality. Adrenals/Urinary Tract: Adrenal glands are within normal limits. Kidneys demonstrate a normal enhancement pattern bilaterally. No renal calculi or obstructive changes are seen. The ureters are within normal limits. Bladder is well distended. Stomach/Bowel: No obstructive or inflammatory changes of the colon are seen. The appendix is within normal limits. Small bowel and stomach are unremarkable. Vascular/Lymphatic: No significant vascular findings are present. No enlarged abdominal or pelvic lymph nodes. Reproductive: Prostate is unremarkable. Other: No abdominal wall hernia or abnormality. No abdominopelvic ascites. Musculoskeletal: No acute or significant osseous findings. Review of the MIP images confirms the above findings. IMPRESSION: CTA of the chest: No evidence of pulmonary embolism. No acute  abnormality noted. CT of the abdomen and pelvis: Fatty liver. No other focal abnormality is noted. Electronically Signed   By: Mark  Lukens M.D.   On: 06/28/2022 22:14   DG Chest Portable 1 View  Result Date: 06/28/2022 CLINICAL DATA:  Hemoptysis. EXAM: PORTABLE CHEST 1 VIEW COMPARISON:  None Available. FINDINGS: The heart size and mediastinal contours are within normal limits. No consolidation, effusion, or pneumothorax. No acute osseous abnormality. IMPRESSION: No active disease. Electronically Signed   By: Laura  Taylor M.D.   On: 06/28/2022 20:47    Impression/Plan: Melenic stools in setting of chronic NSAID use concerning for peptic ulcer disease. EGD today to further evaluate. NPO for EGD today. IV PPI Q 12 hours.    LOS: 0 days   Ocean Kearley C Lunabelle Oatley  06/30/2022, 1:22 PM  Questions please call [phone]   

## 2022-06-30 NOTE — Anesthesia Postprocedure Evaluation (Signed)
Anesthesia Post Note  Patient: Jerome Young  Procedure(s) Performed: ESOPHAGOGASTRODUODENOSCOPY (EGD) WITH PROPOFOL BIOPSY     Patient location during evaluation: PACU Anesthesia Type: MAC Level of consciousness: awake and alert Pain management: pain level controlled Vital Signs Assessment: post-procedure vital signs reviewed and stable Respiratory status: spontaneous breathing Cardiovascular status: stable Anesthetic complications: no   No notable events documented.  Last Vitals:  Vitals:   06/30/22 1640 06/30/22 1650  BP: (!) 112/56 (!) 114/59  Pulse: 88 84  Resp: 20 16  Temp:    SpO2: 100% 99%    Last Pain:  Vitals:   06/30/22 1650  TempSrc:   PainSc: 0-No pain                 Nolon Nations

## 2022-06-30 NOTE — Brief Op Note (Signed)
Mallory-Weiss tear likely source of the GI bleed. No active bleeding. See procedure report for details. Continue IV PPI Q 12 hours. Clear liquid diet. Home tomorrow if stable and tolerating advancing diet tomorrow morning.

## 2022-07-01 DIAGNOSIS — K921 Melena: Secondary | ICD-10-CM | POA: Diagnosis not present

## 2022-07-01 LAB — CBC
HCT: 31.6 % — ABNORMAL LOW (ref 39.0–52.0)
Hemoglobin: 10.6 g/dL — ABNORMAL LOW (ref 13.0–17.0)
MCH: 29.2 pg (ref 26.0–34.0)
MCHC: 33.5 g/dL (ref 30.0–36.0)
MCV: 87.1 fL (ref 80.0–100.0)
Platelets: 222 10*3/uL (ref 150–400)
RBC: 3.63 MIL/uL — ABNORMAL LOW (ref 4.22–5.81)
RDW: 12.8 % (ref 11.5–15.5)
WBC: 7.3 10*3/uL (ref 4.0–10.5)
nRBC: 0 % (ref 0.0–0.2)

## 2022-07-01 MED ORDER — PANTOPRAZOLE SODIUM 40 MG PO TBEC: DELAYED_RELEASE_TABLET | ORAL | 0 refills | Status: DC

## 2022-07-01 NOTE — Progress Notes (Signed)
  Transition of Care [facility]) Screening Note   Patient Details  Name: Jerome Young Date of Birth: [birth year]   Transition of [facility]) CM/SW Contact:    Vassie Moselle, LCSW Phone Number: 07/01/2022, 9:55 AM    Transition of Care Department [facility]) has reviewed patient and no TOC needs have been identified at this time. We will continue to monitor patient advancement through interdisciplinary progression rounds. If new patient transition needs arise, please place a TOC consult.

## 2022-07-01 NOTE — Progress Notes (Signed)
[facility] Gastroenterology Progress Note  Jerome Young 41 y.o. 12-18-1980   Subjective: Feels good. Tolerating soft diet. No BMs. No rectal bleeding. No N/V. Wife in room.  Objective: Vital signs: Vitals:   06/30/22 2115 07/01/22 0522  BP: 118/86 108/71  Pulse: 79 74  Resp: 18 18  Temp: 98.7 F (37.1 C) 98.3 F (36.8 C)  SpO2: 100% 100%    Physical Exam: Gen: alert, no acute distress, well-nourished HEENT: anicteric sclera CV: RRR Chest: CTA B Abd: soft, nontender, nondistended, +BS Ext: no edema  Lab Results: Recent Labs    06/28/22 2026 06/30/22 0512  NA 138 137  K 4.0 4.1  CL 104 107  CO2 23 24  GLUCOSE 123* 101*  BUN 31* 15  CREATININE 1.08 1.07  CALCIUM 9.0 8.7*  MG  --  2.1  PHOS  --  3.8   Recent Labs    06/28/22 2026 06/30/22 0512  AST 14* 15  ALT 17 18  ALKPHOS 72 58  BILITOT 0.6 1.4*  PROT 7.2 6.1*  ALBUMIN 4.2 3.5   Recent Labs    06/28/22 2026 06/29/22 1147 06/30/22 0512 06/30/22 0912 07/01/22 0452  WBC 14.0*  --  7.6  --  7.3  NEUTROABS 8.0*  --  4.4  --   --   HGB 13.1   < > 10.4* 10.3* 10.6*  HCT 40.6   < > 31.3* 31.5* 31.6*  MCV 87.5  --  88.2  --  87.1  PLT 308  --  213  --  222   < > = values in this interval not displayed.      Assessment/Plan: Jerome Young tear - no signs of ongoing bleeding. Tolerating soft diet. Ok to go home from my standpoint. F/U with me in 6 weeks (office will arrange). Continue PPI BID for 4 weeks and then QD. Remain on soft diet today and tomorrow and then low fat diet. Avoid NSAIDs and alcohol and reports rare past use of alcohol and that he is stopping the NSAIDs.  Jerome Young 07/01/2022, 12:11 PM  Questions please call [phone] Patient ID: Jerome Young, male   DOB: 1981/04/08, 41 y.o.   MRN: [id number]

## 2022-07-01 NOTE — Discharge Summary (Signed)
Physician Discharge Summary  Jerome Young XLK:[id number] DOB: 1981/03/13 DOA: 06/29/2022  PCP: Gwenlyn Fudge, FNP  Admit date: 06/29/2022 Discharge date: 07/01/2022  Admitted From: Home Disposition: Home  Recommendations for Outpatient Follow-up:  Follow up with PCP in 1-2 weeks Follow-up with [facility] gastroenterology as needed Protonix 40 mg p.o. twice daily x4 weeks followed by once daily Continue to encourage avoidance of NSAIDs Follow-up biopsy results from EGD that are pending at time of discharge  Home Health: No Equipment/Devices: None  Discharge Condition: Stable CODE STATUS: Full code Diet recommendation: Low-fat diet  History of present illness:  Jerome Young is a 41 y.o. male with past medical history significant for migraine headaches with frequent use of NSAIDs (ibuprofen, Aleve, Goody powder) who presented to [facility] ED with reports of black stool.  Was seen recently in the ED for spitting up blood; but was ultimately discharged home after her extensive work-up with CT abdomen/pelvis and CTA chest that were unrevealing except for fatty liver.   In the ED, temperature 98.8 F, HR 130, RR 16, BP 144/99, SPO2 100% on room air.  Hemoglobin 10.6, FOBT positive.  EDP started on Protonix 40 mg IV x1, 1 L NS bolus.  EDP consulted [facility] gastroenterology.  Patient was transferred to [facility] under the hospital service for concern of upper GI bleed likely secondary to NSAID abuse.  Hospital course:  Upper GI bleed, likely NSAID induced Acute blood loss anemia Patient presenting to ED with dark tarry stools associated with hematemesis.  Patient's hemoglobin 13.1 on 10/23 now down to 10.4.  Reports NSAID abuse with ibuprofen, Aleve and Goody powders due to his frequent headaches.  [facility] was consulted and underwent EGD on 10/25 with small nonbleeding Mallory-Weiss tear with stigmata of recent bleeding, inflammation/erythema gastric  antrum s/p biopsy, mucosal changes erythema and erosion duodenal bulb, LA grade B esophagitis at the GE junction.  Patient's diet was slowly advanced with toleration.  Discussed cessation from all NSAIDs in the future.  Continue Protonix 40 mg p.o. twice daily x4 weeks followed by once daily thereafter.  Outpatient follow-up with GI for biopsy results that are pending at time of discharge.   Tachycardia: Resolved Patient presenting with tachycardia, likely secondary to acute blood loss anemia with 3 g drop in hemoglobin.  Now resolved.  Discharge Diagnoses:  Principal Problem:   Melena Active Problems:   GIB (gastrointestinal bleeding)    Discharge Instructions  Discharge Instructions     Call MD for:  difficulty breathing, headache or visual disturbances   Complete by: As directed    Call MD for:  extreme fatigue   Complete by: As directed    Call MD for:  persistant dizziness or light-headedness   Complete by: As directed    Call MD for:  persistant nausea and vomiting   Complete by: As directed    Call MD for:  severe uncontrolled pain   Complete by: As directed    Call MD for:  temperature >100.4   Complete by: As directed    Diet - low sodium heart healthy   Complete by: As directed    Increase activity slowly   Complete by: As directed       Allergies as of 07/01/2022       Reactions   Naproxen Palpitations   Penicillins Other (See Comments)   Mother states he is allergic   Latex Rash        Medication List  STOP taking these medications    GOODYS EXTRA STRENGTH PO       TAKE these medications    pantoprazole 40 MG tablet Commonly known as: Protonix Take 1 tablet (40 mg total) by mouth 2 (two) times daily for 28 days, THEN 1 tablet (40 mg total) daily. Start taking on: July 01, 2022        Follow-up Information     Loman Brooklyn, FNP. Schedule an appointment as soon as possible for a visit in 1 week(s).   Specialty: Family  Medicine Contact information: [place] [address] [phone]         Wilford Corner, MD Follow up.   Specialty: Gastroenterology Contact information: [address] [place] [address] [phone]                Allergies  Allergen Reactions   Naproxen Palpitations   Penicillins Other (See Comments)    Mother states he is allergic   Latex Rash    Consultations: [facility], Dr. Michail Sermon   Procedures/Studies: CT Angio Chest PE W and/or Wo Contrast  Result Date: 06/28/2022 CLINICAL DATA:  Hemoptysis EXAM: CT ANGIOGRAPHY CHEST CT ABDOMEN AND PELVIS WITH CONTRAST TECHNIQUE: Multidetector CT imaging of the chest was performed using the standard protocol during bolus administration of intravenous contrast. Multiplanar CT image reconstructions and MIPs were obtained to evaluate the vascular anatomy. Multidetector CT imaging of the abdomen and pelvis was performed using the standard protocol during bolus administration of intravenous contrast. RADIATION DOSE REDUCTION: This exam was performed according to the departmental dose-optimization program which includes automated exposure control, adjustment of the mA and/or kV according to patient size and/or use of iterative reconstruction technique. CONTRAST:  161mL OMNIPAQUE IOHEXOL 350 MG/ML SOLN COMPARISON:  Chest x-ray from earlier in the same day. FINDINGS: CTA CHEST FINDINGS Cardiovascular: Thoracic aorta and its branches are within normal limits. No aneurysmal dilatation or dissection is seen. No cardiac enlargement is noted. No coronary calcifications are seen. The pulmonary artery shows a normal branching pattern bilaterally. No filling defect to suggest pulmonary embolism is seen. Mediastinum/Nodes: Thoracic inlet is within normal limits. No sizable hilar or mediastinal adenopathy is noted. The esophagus as visualized is within normal limits. Lungs/Pleura: The lungs are well  aerated bilaterally. No focal infiltrate or effusion is seen. No sizable parenchymal nodule is noted. Musculoskeletal: No acute rib abnormality is noted. No compression deformity is seen. Review of the MIP images confirms the above findings. CT ABDOMEN and PELVIS FINDINGS Hepatobiliary: Fatty infiltration of the liver is noted. The gallbladder is within normal limits. Pancreas: Unremarkable. No pancreatic ductal dilatation or surrounding inflammatory changes. Spleen: Normal in size without focal abnormality. Adrenals/Urinary Tract: Adrenal glands are within normal limits. Kidneys demonstrate a normal enhancement pattern bilaterally. No renal calculi or obstructive changes are seen. The ureters are within normal limits. Bladder is well distended. Stomach/Bowel: No obstructive or inflammatory changes of the colon are seen. The appendix is within normal limits. Small bowel and stomach are unremarkable. Vascular/Lymphatic: No significant vascular findings are present. No enlarged abdominal or pelvic lymph nodes. Reproductive: Prostate is unremarkable. Other: No abdominal wall hernia or abnormality. No abdominopelvic ascites. Musculoskeletal: No acute or significant osseous findings. Review of the MIP images confirms the above findings. IMPRESSION: CTA of the chest: No evidence of pulmonary embolism. No acute abnormality noted. CT of the abdomen and pelvis: Fatty liver. No other focal abnormality is noted. Electronically Signed   By: Elta Guadeloupe  Lukens M.D.   On: 06/28/2022 22:14   CT ABDOMEN PELVIS W CONTRAST  Result Date: 06/28/2022 CLINICAL DATA:  Hemoptysis EXAM: CT ANGIOGRAPHY CHEST CT ABDOMEN AND PELVIS WITH CONTRAST TECHNIQUE: Multidetector CT imaging of the chest was performed using the standard protocol during bolus administration of intravenous contrast. Multiplanar CT image reconstructions and MIPs were obtained to evaluate the vascular anatomy. Multidetector CT imaging of the abdomen and pelvis was performed  using the standard protocol during bolus administration of intravenous contrast. RADIATION DOSE REDUCTION: This exam was performed according to the departmental dose-optimization program which includes automated exposure control, adjustment of the mA and/or kV according to patient size and/or use of iterative reconstruction technique. CONTRAST:  100mL OMNIPAQUE IOHEXOL 350 MG/ML SOLN COMPARISON:  Chest x-ray from earlier in the same day. FINDINGS: CTA CHEST FINDINGS Cardiovascular: Thoracic aorta and its branches are within normal limits. No aneurysmal dilatation or dissection is seen. No cardiac enlargement is noted. No coronary calcifications are seen. The pulmonary artery shows a normal branching pattern bilaterally. No filling defect to suggest pulmonary embolism is seen. Mediastinum/Nodes: Thoracic inlet is within normal limits. No sizable hilar or mediastinal adenopathy is noted. The esophagus as visualized is within normal limits. Lungs/Pleura: The lungs are well aerated bilaterally. No focal infiltrate or effusion is seen. No sizable parenchymal nodule is noted. Musculoskeletal: No acute rib abnormality is noted. No compression deformity is seen. Review of the MIP images confirms the above findings. CT ABDOMEN and PELVIS FINDINGS Hepatobiliary: Fatty infiltration of the liver is noted. The gallbladder is within normal limits. Pancreas: Unremarkable. No pancreatic ductal dilatation or surrounding inflammatory changes. Spleen: Normal in size without focal abnormality. Adrenals/Urinary Tract: Adrenal glands are within normal limits. Kidneys demonstrate a normal enhancement pattern bilaterally. No renal calculi or obstructive changes are seen. The ureters are within normal limits. Bladder is well distended. Stomach/Bowel: No obstructive or inflammatory changes of the colon are seen. The appendix is within normal limits. Small bowel and stomach are unremarkable. Vascular/Lymphatic: No significant vascular  findings are present. No enlarged abdominal or pelvic lymph nodes. Reproductive: Prostate is unremarkable. Other: No abdominal wall hernia or abnormality. No abdominopelvic ascites. Musculoskeletal: No acute or significant osseous findings. Review of the MIP images confirms the above findings. IMPRESSION: CTA of the chest: No evidence of pulmonary embolism. No acute abnormality noted. CT of the abdomen and pelvis: Fatty liver. No other focal abnormality is noted. Electronically Signed   By: Alcide CleverMark  Lukens M.D.   On: 06/28/2022 22:14   DG Chest Portable 1 View  Result Date: 06/28/2022 CLINICAL DATA:  Hemoptysis. EXAM: PORTABLE CHEST 1 VIEW COMPARISON:  None Available. FINDINGS: The heart size and mediastinal contours are within normal limits. No consolidation, effusion, or pneumothorax. No acute osseous abnormality. IMPRESSION: No active disease. Electronically Signed   By: Thornell SartoriusLaura  Taylor M.D.   On: 06/28/2022 20:47     Subjective: Patient seen examined bedside, resting calmly.  Spouse present.  No specific complaints this morning.  Tolerating advance diet.  Okay for discharge home per GI.  Discussed continued cessation from NSAIDs as leading etiology to his symptoms/hospitalization.  No other questions or concerns at this time.  Denies headache, no dizziness, no chest pain, no palpitations, no fever/chills/night sweats, no nausea/vomiting/diarrhea, no abdominal pain, no focal weakness, no cough/congestion, no fatigue, no paresthesias.  No acute events overnight per nursing staff.  Discharge Exam: Vitals:   06/30/22 2115 07/01/22 0522  BP: 118/86 108/71  Pulse: 79 74  Resp: 18 18  Temp: 98.7 F (37.1 C) 98.3 F (36.8 C)  SpO2: 100% 100%   Vitals:   06/30/22 1640 06/30/22 1650 06/30/22 2115 07/01/22 0522  BP: (!) 112/56 (!) 114/59 118/86 108/71  Pulse: 88 84 79 74  Resp: 20 16 18 18   Temp:   98.7 F (37.1 C) 98.3 F (36.8 C)  TempSrc:      SpO2: 100% 99% 100% 100%  Weight:      Height:         Physical Exam: GEN: NAD, alert and oriented x 3, wd/wn HEENT: NCAT, PERRL, EOMI, sclera clear, MMM PULM: CTAB w/o wheezes/crackles, normal respiratory effort, on room air CV: RRR w/o M/G/R GI: abd soft, NTND, NABS, no R/G/M MSK: no peripheral edema, muscle strength globally intact 5/5 bilateral upper/lower extremities NEURO: CN II-XII intact, no focal deficits, sensation to light touch intact PSYCH: normal mood/affect Integumentary: dry/intact, no rashes or wounds    The results of significant diagnostics from this hospitalization (including imaging, microbiology, ancillary and laboratory) are listed below for reference.     Microbiology: No results found for this or any previous visit (from the past 240 hour(s)).   Labs: BNP (last 3 results) No results for input(s): "BNP" in the last 8760 hours. Basic Metabolic Panel: Recent Labs  Lab 06/28/22 2026 06/30/22 0512  NA 138 137  K 4.0 4.1  CL 104 107  CO2 23 24  GLUCOSE 123* 101*  BUN 31* 15  CREATININE 1.08 1.07  CALCIUM 9.0 8.7*  MG  --  2.1  PHOS  --  3.8   Liver Function Tests: Recent Labs  Lab 06/28/22 2026 06/30/22 0512  AST 14* 15  ALT 17 18  ALKPHOS 72 58  BILITOT 0.6 1.4*  PROT 7.2 6.1*  ALBUMIN 4.2 3.5   Recent Labs  Lab 06/28/22 2026  LIPASE 13   No results for input(s): "AMMONIA" in the last 168 hours. CBC: Recent Labs  Lab 06/28/22 2026 06/29/22 1147 06/29/22 2143 06/30/22 0512 06/30/22 0912 07/01/22 0452  WBC 14.0*  --   --  7.6  --  7.3  NEUTROABS 8.0*  --   --  4.4  --   --   HGB 13.1 10.6* 11.2* 10.4* 10.3* 10.6*  HCT 40.6 31.7* 33.5* 31.3* 31.5* 31.6*  MCV 87.5  --   --  88.2  --  87.1  PLT 308  --   --  213  --  222   Cardiac Enzymes: No results for input(s): "CKTOTAL", "CKMB", "CKMBINDEX", "TROPONINI" in the last 168 hours. BNP: Invalid input(s): "POCBNP" CBG: No results for input(s): "GLUCAP" in the last 168 hours. D-Dimer No results for input(s): "DDIMER" in the  last 72 hours. Hgb A1c No results for input(s): "HGBA1C" in the last 72 hours. Lipid Profile No results for input(s): "CHOL", "HDL", "LDLCALC", "TRIG", "CHOLHDL", "LDLDIRECT" in the last 72 hours. Thyroid function studies No results for input(s): "TSH", "T4TOTAL", "T3FREE", "THYROIDAB" in the last 72 hours.  Invalid input(s): "FREET3" Anemia work up No results for input(s): "VITAMINB12", "FOLATE", "FERRITIN", "TIBC", "IRON", "RETICCTPCT" in the last 72 hours. Urinalysis No results found for: "COLORURINE", "APPEARANCEUR", "LABSPEC", "PHURINE", "GLUCOSEU", "HGBUR", "BILIRUBINUR", "KETONESUR", "PROTEINUR", "UROBILINOGEN", "NITRITE", "LEUKOCYTESUR" Sepsis Labs Recent Labs  Lab 06/28/22 2026 06/30/22 0512 07/01/22 0452  WBC 14.0* 7.6 7.3   Microbiology No results found for this or any previous visit (from the past 240 hour(s)).   Time coordinating discharge: Over 30 minutes  SIGNED:   07/03/22 Alvira Philips, DO  [facility] 07/01/2022, 12:24 PM

## 2022-07-01 NOTE — Plan of Care (Signed)

## 2022-07-02 ENCOUNTER — Telehealth: Payer: Self-pay

## 2022-07-02 ENCOUNTER — Encounter (HOSPITAL_COMMUNITY): Payer: Self-pay | Admitting: Gastroenterology

## 2022-07-02 ENCOUNTER — Telehealth: Payer: Self-pay | Admitting: Family Medicine

## 2022-07-02 NOTE — Telephone Encounter (Signed)
Transition Care Management Follow-up Telephone Call Date of discharge and from where: 07/01/2022 Jerome Young  How have you been since you were released from the hospital? Patient is doing well - arm is sore from the iv patient is alternating heat and ice  Any questions or concerns? No  Items Reviewed: Did the pt receive and understand the discharge instructions provided? Yes  Medications obtained and verified? Yes  Other? Yes  Any new allergies since your discharge? Yes  Dietary orders reviewed? Yes Do you have support at home? Yes   Home Care and Equipment/Supplies: Were home health services ordered? not applicable If so, what is the name of the agency? na  Has the agency set up a time to come to the patient's home? not applicable Were any new equipment or medical supplies ordered?  No What is the name of the medical supply agency? na Were you able to get the supplies/equipment? not applicable Do you have any questions related to the use of the equipment or supplies? No  Functional Questionnaire: (I = Independent and D = Dependent) ADLs: I  Bathing/Dressing- I  Meal Prep- I  Eating- I  Maintaining continence- I  Transferring/Ambulation- I  Managing Meds- I  Follow up appointments reviewed:  PCP Hospital f/u appt confirmed? Yes  Scheduled to see Jerome Young on 07/08/2022 @ 350pm. Jerome Young Hospital f/u appt confirmed? Yes  Scheduled to see GI on 08/07/2022  Are transportation arrangements needed? No  If their condition worsens, is the pt aware to call PCP or go to the Emergency Dept.? Yes Was the patient provided with contact information for the PCP's office or ED? Yes Was to pt encouraged to call back with questions or concerns? Yes

## 2022-07-05 LAB — SURGICAL PATHOLOGY

## 2022-07-05 NOTE — Telephone Encounter (Signed)
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

## 2022-07-08 ENCOUNTER — Ambulatory Visit (INDEPENDENT_AMBULATORY_CARE_PROVIDER_SITE_OTHER): Payer: 59 | Admitting: Family Medicine

## 2022-07-08 ENCOUNTER — Encounter: Payer: Self-pay | Admitting: Family Medicine

## 2022-07-08 VITALS — BP 136/89 | HR 98 | Temp 97.7°F | Ht 71.0 in | Wt 208.4 lb

## 2022-07-08 DIAGNOSIS — Z09 Encounter for follow-up examination after completed treatment for conditions other than malignant neoplasm: Secondary | ICD-10-CM

## 2022-07-08 DIAGNOSIS — K922 Gastrointestinal hemorrhage, unspecified: Secondary | ICD-10-CM | POA: Diagnosis not present

## 2022-07-08 DIAGNOSIS — R000 Tachycardia, unspecified: Secondary | ICD-10-CM | POA: Diagnosis not present

## 2022-07-08 DIAGNOSIS — K921 Melena: Secondary | ICD-10-CM

## 2022-07-08 DIAGNOSIS — D62 Acute posthemorrhagic anemia: Secondary | ICD-10-CM

## 2022-07-08 NOTE — Progress Notes (Signed)
Subjective:  Patient ID: Jerome Young, male    DOB: Aug 04, 1981, 41 y.o.   MRN: [id number]  Patient Care Team: Baruch Gouty, FNP as PCP - General (Family Medicine)   Chief Complaint:  Hospitalization Follow-up ([facility] 10/24-10/26 Melena, GI bleed )   HPI: Jerome Young is a 41 y.o. male presenting on 07/08/2022 for Hospitalization Follow-up ([facility] 10/24-10/26 Melena, GI bleed )   Today's visit was for Transitional Care Management. The patient was discharged from [facility] on 07/01/2022 with a primary diagnosis of melena, upper GI bleed, acute blood loss anemia, and tachycardia. Contact with the patient and/or caregiver, by a clinical staff member, was made on 07/02/2022 and was documented as a telephone encounter within the EMR. Through chart review and discussion with the patient I have determined that management of their condition is of high complexity.   Patient presented to the ED with c/o dark tarry stools associated with hematemesis. Reported NSAID abuse with ibuprofen, Aleve and Goody powders due to his frequent headaches. He had been seen the day before in the ED with a benign workup. Hgb on 10/23 was 13.1, Hgb on 10/24 was 10.6. Pt was admitted and [facility] was consulted.  Pt underwent EGD on 10/25, noted small nonbleeding Mallory-Weiss tear with stigmata of recent bleeding, inflammation/erythema gastric antrum s/p biopsy, mucosal changes erythema and erosion duodenal bulb, LA grade B esophagitis at the GE junction. He was treated with IV protonix in hospital and discharged home on oral protonix. States he has done very well since being home. No melena, hematochezia, hemoptysis, abdominal pain, trouble swallowing, or headaches. No weakness or fatigue. No palpitations, shortness of breath, or dizziness. Has been advancing diet slowly and is tolerating well.     Relevant past medical, surgical, family, and social history reviewed and updated as indicated.  Allergies and medications  reviewed and updated. Data reviewed: Chart in Epic.   Past Medical History:  Diagnosis Date   Migraines     Past Surgical History:  Procedure Laterality Date   BIOPSY  06/30/2022   Procedure: BIOPSY;  Surgeon: Wilford Corner, MD;  Location: [facility];  Service: Gastroenterology;;   ESOPHAGOGASTRODUODENOSCOPY (EGD) WITH PROPOFOL N/A 06/30/2022   Procedure: ESOPHAGOGASTRODUODENOSCOPY (EGD) WITH PROPOFOL;  Surgeon: Wilford Corner, MD;  Location: [facility];  Service: Gastroenterology;  Laterality: N/A;    Social History   Socioeconomic History   Marital status: Married    Spouse name: Not on file   Number of children: Not on file   Years of education: Not on file   Highest education level: Not on file  Occupational History   Not on file  Tobacco Use   Smoking status: Never   Smokeless tobacco: Never  Vaping Use   Vaping Use: Never used  Substance and Sexual Activity   Alcohol use: Not Currently    Comment: socially   Drug use: Never   Sexual activity: Yes    Birth control/protection: Surgical  Other Topics Concern   Not on file  Social History Narrative   Not on file   Social Determinants of Health   Financial Resource Strain: Not on file  Food Insecurity: Not on file  Transportation Needs: Not on file  Physical Activity: Not on file  Stress: Not on file  Social Connections: Not on file  Intimate Partner Violence: Not on file    Outpatient Encounter Medications as of 07/08/2022  Medication Sig   pantoprazole (PROTONIX) 40 MG tablet Take 1 tablet (40  mg total) by mouth 2 (two) times daily for 28 days, THEN 1 tablet (40 mg total) daily.   No facility-administered encounter medications on file as of 07/08/2022.    Allergies  Allergen Reactions   Naproxen Palpitations   Penicillins Other (See Comments)    Mother states he is allergic   Latex Rash    Review of Systems  Constitutional:  Negative for activity change, appetite change, chills,  diaphoresis, fatigue, fever and unexpected weight change.  HENT: Negative.    Eyes: Negative.   Respiratory:  Negative for apnea, cough, choking, chest tightness, shortness of breath, wheezing and stridor.   Cardiovascular:  Negative for chest pain, palpitations and leg swelling.  Gastrointestinal:  Negative for abdominal distention, abdominal pain, anal bleeding, blood in stool, constipation, diarrhea, nausea, rectal pain and vomiting.  Endocrine: Negative.   Genitourinary:  Negative for decreased urine volume, difficulty urinating, dysuria, frequency and urgency.  Musculoskeletal:  Negative for arthralgias and myalgias.  Skin: Negative.   Allergic/Immunologic: Negative.   Neurological:  Negative for dizziness, tremors, seizures, syncope, facial asymmetry, speech difficulty, weakness, light-headedness, numbness and headaches.  Hematological: Negative.   Psychiatric/Behavioral:  Negative for confusion, hallucinations, sleep disturbance and suicidal ideas.   All other systems reviewed and are negative.       Objective:  BP 136/89   Pulse 98   Temp 97.7 F (36.5 C) (Temporal)   Ht _0  (1.803 m)   Wt 208 lb 6.4 oz (94.5 kg)   SpO2 100%   BMI 29.07 kg/m    Wt Readings from Last 3 Encounters:  07/08/22 208 lb 6.4 oz (94.5 kg)  06/29/22 207 lb 10.8 oz (94.2 kg)  06/28/22 210 lb (95.3 kg)    Physical Exam Vitals and nursing note reviewed.  Constitutional:      General: He is not in acute distress.    Appearance: Normal appearance. He is well-developed and well-groomed. He is not ill-appearing, toxic-appearing or diaphoretic.  HENT:     Head: Normocephalic and atraumatic.     Jaw: There is normal jaw occlusion.     Right Ear: Hearing normal.     Left Ear: Hearing normal.     Nose: Nose normal.     Mouth/Throat:     Lips: Pink.     Mouth: Mucous membranes are moist.     Pharynx: Oropharynx is clear. Uvula midline.  Eyes:     General: Lids are normal.     Extraocular  Movements: Extraocular movements intact.     Conjunctiva/sclera: Conjunctivae normal.     Pupils: Pupils are equal, round, and reactive to light.  Neck:     Thyroid: No thyroid mass, thyromegaly or thyroid tenderness.     Vascular: No carotid bruit or JVD.     Trachea: Trachea and phonation normal.  Cardiovascular:     Rate and Rhythm: Normal rate and regular rhythm.     Chest Wall: PMI is not displaced.     Pulses: Normal pulses.     Heart sounds: Normal heart sounds. No murmur heard.    No friction rub. No gallop.  Pulmonary:     Effort: Pulmonary effort is normal. No respiratory distress.     Breath sounds: Normal breath sounds. No wheezing.  Abdominal:     General: Bowel sounds are normal. There is no distension or abdominal bruit.     Palpations: Abdomen is soft. There is no hepatomegaly, splenomegaly or mass.     Tenderness: There is no  abdominal tenderness. There is no right CVA tenderness, left CVA tenderness, guarding or rebound.     Hernia: No hernia is present.  Musculoskeletal:        General: Normal range of motion.     Cervical back: Normal range of motion and neck supple.     Right lower leg: No edema.     Left lower leg: No edema.  Lymphadenopathy:     Cervical: No cervical adenopathy.  Skin:    General: Skin is warm and dry.     Capillary Refill: Capillary refill takes less than 2 seconds.     Coloration: Skin is not cyanotic, jaundiced or pale.     Findings: No rash.  Neurological:     General: No focal deficit present.     Mental Status: He is alert and oriented to person, place, and time.     Sensory: Sensation is intact.     Motor: Motor function is intact.     Coordination: Coordination is intact.     Gait: Gait is intact.     Deep Tendon Reflexes: Reflexes are normal and symmetric.  Psychiatric:        Attention and Perception: Attention and perception normal.        Mood and Affect: Mood and affect normal.        Speech: Speech normal.         Behavior: Behavior normal. Behavior is cooperative.        Thought Content: Thought content normal.        Cognition and Memory: Cognition and memory normal.        Judgment: Judgment normal.     Results for orders placed or performed during the hospital encounter of 06/29/22  Occult blood card to lab, stool  Result Value Ref Range   Fecal Occult Bld POSITIVE (A) NEGATIVE  Hemoglobin and hematocrit, blood  Result Value Ref Range   Hemoglobin 10.6 (L) 13.0 - 17.0 g/dL   HCT 31.7 (L) 39.0 - 52.0 %  Hemoglobin and hematocrit, blood  Result Value Ref Range   Hemoglobin 11.2 (L) 13.0 - 17.0 g/dL   HCT 33.5 (L) 39.0 - 52.0 %  Hemoglobin and hematocrit, blood  Result Value Ref Range   Hemoglobin 10.3 (L) 13.0 - 17.0 g/dL   HCT 31.5 (L) 39.0 - 52.0 %  CBC with Differential/Platelet  Result Value Ref Range   WBC 7.6 4.0 - 10.5 K/uL   RBC 3.55 (L) 4.22 - 5.81 MIL/uL   Hemoglobin 10.4 (L) 13.0 - 17.0 g/dL   HCT 31.3 (L) 39.0 - 52.0 %   MCV 88.2 80.0 - 100.0 fL   MCH 29.3 26.0 - 34.0 pg   MCHC 33.2 30.0 - 36.0 g/dL   RDW 12.9 11.5 - 15.5 %   Platelets 213 150 - 400 K/uL   nRBC 0.0 0.0 - 0.2 %   Neutrophils Relative % 58 %   Neutro Abs 4.4 1.7 - 7.7 K/uL   Lymphocytes Relative 30 %   Lymphs Abs 2.3 0.7 - 4.0 K/uL   Monocytes Relative 9 %   Monocytes Absolute 0.6 0.1 - 1.0 K/uL   Eosinophils Relative 3 %   Eosinophils Absolute 0.2 0.0 - 0.5 K/uL   Basophils Relative 0 %   Basophils Absolute 0.0 0.0 - 0.1 K/uL   Immature Granulocytes 0 %   Abs Immature Granulocytes 0.03 0.00 - 0.07 K/uL  Comprehensive metabolic panel  Result Value Ref Range   Sodium 137  135 - 145 mmol/L   Potassium 4.1 3.5 - 5.1 mmol/L   Chloride 107 98 - 111 mmol/L   CO2 24 22 - 32 mmol/L   Glucose, Bld 101 (H) 70 - 99 mg/dL   BUN 15 6 - 20 mg/dL   Creatinine, Ser 1.07 0.61 - 1.24 mg/dL   Calcium 8.7 (L) 8.9 - 10.3 mg/dL   Total Protein 6.1 (L) 6.5 - 8.1 g/dL   Albumin 3.5 3.5 - 5.0 g/dL   AST 15 15 - 41  U/L   ALT 18 0 - 44 U/L   Alkaline Phosphatase 58 38 - 126 U/L   Total Bilirubin 1.4 (H) 0.3 - 1.2 mg/dL   GFR, Estimated >60 >60 mL/min   Anion gap 6 5 - 15  Magnesium  Result Value Ref Range   Magnesium 2.1 1.7 - 2.4 mg/dL  Phosphorus  Result Value Ref Range   Phosphorus 3.8 2.5 - 4.6 mg/dL  HIV Antibody (routine testing w rflx)  Result Value Ref Range   HIV Screen 4th Generation wRfx Non Reactive Non Reactive  CBC  Result Value Ref Range   WBC 7.3 4.0 - 10.5 K/uL   RBC 3.63 (L) 4.22 - 5.81 MIL/uL   Hemoglobin 10.6 (L) 13.0 - 17.0 g/dL   HCT 31.6 (L) 39.0 - 52.0 %   MCV 87.1 80.0 - 100.0 fL   MCH 29.2 26.0 - 34.0 pg   MCHC 33.5 30.0 - 36.0 g/dL   RDW 12.8 11.5 - 15.5 %   Platelets 222 150 - 400 K/uL   nRBC 0.0 0.0 - 0.2 %  Surgical pathology  Result Value Ref Range   SURGICAL PATHOLOGY      SURGICAL PATHOLOGY CASE: [id number] PATIENT: Annamarie Major Surgical Pathology Report     Clinical History: melena     FINAL MICROSCOPIC DIAGNOSIS:  A. DISTAL STOMACH, BIOPSY: - GASTRIC, TRANSITIONAL TYPE MUCOSA WITH CHRONIC, FOCALLY ACTIVE GASTRITIS. - IMMUNOSTAIN FOR HELICOBACTER ORGANISMS IS NEGATIVE.  GROSS DESCRIPTION:  Received in formalin is a tan, soft tissue fragment that is submitted in toto.  Size: 0.4 cm, 1 block submitted.  Craig Staggers 07/01/2022)    Final Diagnosis performed by Maureen Ralphs, MD.   Electronically signed 07/05/2022 Technical component performed at [facility], [place] [address] [place], [place] [address].  Professional component performed at [employer]. [facility], [place] [address] [place], [place] [address].  Immunohistochemistry Technical component (if applicable) was performed at [facility]. [address] [place], [place], [place] [address].   IMMUNOHISTOCHEMISTRY DISCLAIMER ( if applicable): Some of these immunohistochemical stains may have been developed and the performance  characteristics determine by [facility]. Some may not have been cleared or approved by the U.S. Food and Drug Administration. The FDA has determined that such clearance or approval is not necessary. This test is used for clinical purposes. It should not be regarded as investigational or for research. This laboratory is certified under the [place] (CLIA-88) as qualified to perform high complexity clinical laboratory testing.  The controls stained appropriately.        Pertinent labs & imaging results that were available during my care of the patient were reviewed by me and considered in my medical decision making.  Assessment & Plan:  Jerome Young was seen today for hospitalization follow-up.  Diagnoses and all orders for this visit:  Hospital discharge follow-up Melena Acute blood loss anemia Tachycardia Upper GI bleed Doing well since  discharge. Will recheck labs today. Continue PPI therapy and follow up with GI as scheduled. Report any return of symptoms. Advance diet as tolerated. Avoid NSAID use.  -     CBC with Differential/Platelet -     CMP14+EGFR  Headache log provided. Pt aware to fill out and bring to follow up visit. Will determine if preventative or abortive therapy is warranted.    Continue all other maintenance medications.  Follow up plan: Return in about 3 months (around 10/08/2022), or if symptoms worsen or fail to improve, for CPE.   Continue healthy lifestyle choices, including diet (rich in fruits, vegetables, and lean proteins, and low in salt and simple carbohydrates) and exercise (at least 30 minutes of moderate physical activity daily).  Educational handout given for migraine headaches, headache log  The above assessment and management plan was discussed with the patient. The patient verbalized understanding of and has agreed to the management plan. Patient is aware to call the clinic if they develop any  new symptoms or if symptoms persist or worsen. Patient is aware when to return to the clinic for a follow-up visit. Patient educated on when it is appropriate to go to the emergency department.   Monia Pouch, FNP-C [facility] [phone]

## 2022-07-09 LAB — CBC WITH DIFFERENTIAL/PLATELET
Basophils Absolute: 0 10*3/uL (ref 0.0–0.2)
Basos: 0 %
EOS (ABSOLUTE): 0.2 10*3/uL (ref 0.0–0.4)
Eos: 2 %
Hematocrit: 34.1 % — ABNORMAL LOW (ref 37.5–51.0)
Hemoglobin: 11.6 g/dL — ABNORMAL LOW (ref 13.0–17.7)
Immature Grans (Abs): 0 10*3/uL (ref 0.0–0.1)
Immature Granulocytes: 0 %
Lymphocytes Absolute: 2.2 10*3/uL (ref 0.7–3.1)
Lymphs: 31 %
MCH: 28.9 pg (ref 26.6–33.0)
MCHC: 34 g/dL (ref 31.5–35.7)
MCV: 85 fL (ref 79–97)
Monocytes Absolute: 0.5 10*3/uL (ref 0.1–0.9)
Monocytes: 8 %
Neutrophils Absolute: 4.2 10*3/uL (ref 1.4–7.0)
Neutrophils: 59 %
Platelets: 366 10*3/uL (ref 150–450)
RBC: 4.02 x10E6/uL — ABNORMAL LOW (ref 4.14–5.80)
RDW: 13 % (ref 11.6–15.4)
WBC: 7.1 10*3/uL (ref 3.4–10.8)

## 2022-07-09 LAB — CMP14+EGFR
ALT: 16 IU/L (ref 0–44)
AST: 12 IU/L (ref 0–40)
Albumin/Globulin Ratio: 1.9 (ref 1.2–2.2)
Albumin: 4.7 g/dL (ref 4.1–5.1)
Alkaline Phosphatase: 99 IU/L (ref 44–121)
BUN/Creatinine Ratio: 8 — ABNORMAL LOW (ref 9–20)
BUN: 9 mg/dL (ref 6–24)
Bilirubin Total: 0.8 mg/dL (ref 0.0–1.2)
CO2: 26 mmol/L (ref 20–29)
Calcium: 9.5 mg/dL (ref 8.7–10.2)
Chloride: 99 mmol/L (ref 96–106)
Creatinine, Ser: 1.2 mg/dL (ref 0.76–1.27)
Globulin, Total: 2.5 g/dL (ref 1.5–4.5)
Glucose: 83 mg/dL (ref 70–99)
Potassium: 4.3 mmol/L (ref 3.5–5.2)
Sodium: 138 mmol/L (ref 134–144)
Total Protein: 7.2 g/dL (ref 6.0–8.5)
eGFR: 78 mL/min/{1.73_m2} (ref >59)

## 2022-10-08 ENCOUNTER — Encounter: Payer: 59 | Admitting: Family Medicine

## 2023-01-26 ENCOUNTER — Encounter: Payer: Self-pay | Admitting: Family Medicine

## 2023-01-26 ENCOUNTER — Ambulatory Visit (INDEPENDENT_AMBULATORY_CARE_PROVIDER_SITE_OTHER): Payer: 59 | Admitting: Family Medicine

## 2023-01-26 VITALS — BP 127/83 | HR 86 | Temp 98.3°F | Ht 71.0 in | Wt 212.6 lb

## 2023-01-26 DIAGNOSIS — Z0000 Encounter for general adult medical examination without abnormal findings: Secondary | ICD-10-CM | POA: Diagnosis not present

## 2023-01-26 NOTE — Progress Notes (Addendum)
Complete physical exam  Patient: Jerome Young   DOB: May 07, 1981   42 y.o. Male  MRN: [id number]  Subjective:    Chief Complaint  Patient presents with   Annual Exam    Jerome Young is a 42 y.o. male who presents today for a complete physical exam. He reports consuming a general diet. Home exercise routine includes regular exercise. Physically active job  He generally feels well. He reports sleeping well. He does not have additional problems to discuss today.    Most recent fall risk assessment:    01/26/2023    2:46 PM  Fall Risk   Falls in the past year? 0     Most recent depression screenings:    01/26/2023    2:46 PM 07/08/2022    3:49 PM  PHQ 2/9 Scores  PHQ - 2 Score 0 0  PHQ- 9 Score 0 0    Vision:Within last year and Dental: No current dental problems and Receives regular dental care  There are no problems to display for this patient.  Past Medical History:  Diagnosis Date   Migraines    Past Surgical History:  Procedure Laterality Date   BIOPSY  06/30/2022   Procedure: BIOPSY;  Surgeon: Charlott Jonny Dearden, MD;  Location: [facility];  Service: Gastroenterology;;   ESOPHAGOGASTRODUODENOSCOPY (EGD) WITH PROPOFOL N/A 06/30/2022   Procedure: ESOPHAGOGASTRODUODENOSCOPY (EGD) WITH PROPOFOL;  Surgeon: Charlott Aviv Lengacher, MD;  Location: [facility];  Service: Gastroenterology;  Laterality: N/A;   Social History   Tobacco Use   Smoking status: Never   Smokeless tobacco: Never  Vaping Use   Vaping Use: Never used  Substance Use Topics   Alcohol use: Not Currently    Comment: socially   Drug use: Never   Social History   Socioeconomic History   Marital status: Married    Spouse name: Not on file   Number of children: Not on file   Years of education: Not on file   Highest education level: Not on file  Occupational History   Not on file  Tobacco Use   Smoking status: Never   Smokeless tobacco: Never  Vaping Use   Vaping Use: Never used  Substance  and Sexual Activity   Alcohol use: Not Currently    Comment: socially   Drug use: Never   Sexual activity: Yes    Birth control/protection: Surgical  Other Topics Concern   Not on file  Social History Narrative   Not on file   Social Determinants of Health   Financial Resource Strain: Not on file  Food Insecurity: Not on file  Transportation Needs: Not on file  Physical Activity: Not on file  Stress: Not on file  Social Connections: Not on file  Intimate Partner Violence: Not on file   Family Status  Relation Name Status   Mother  Alive   Sister  Alive   Daughter  Alive   MGM  Deceased   MGF  Deceased   Family History  Problem Relation Age of Onset   Anxiety disorder Mother    Depression Mother    Anxiety disorder Sister    Depression Sister    Hypertension Maternal Grandmother    Hyperlipidemia Maternal Grandmother    Hypertension Maternal Grandfather    Allergies  Allergen Reactions   Naproxen Palpitations   Penicillins Other (See Comments)    Mother states he is allergic   Latex Rash      Patient Care Team: Sonny Masters, FNP as PCP -  General (Family Medicine)   Outpatient Medications Prior to Visit  Medication Sig   [DISCONTINUED] pantoprazole (PROTONIX) 40 MG tablet Take 1 tablet (40 mg total) by mouth 2 (two) times daily for 28 days, THEN 1 tablet (40 mg total) daily.   No facility-administered medications prior to visit.    ROS        Objective:     BP 127/83   Pulse 86   Temp 98.3 F (36.8 C) (Temporal)   Ht 5\' 11"  (1.803 m)   Wt 212 lb 9.6 oz (96.4 kg)   SpO2 99%   BMI 29.65 kg/m  BP Readings from Last 3 Encounters:  01/26/23 127/83  07/08/22 136/89  07/01/22 108/71   Wt Readings from Last 3 Encounters:  01/26/23 212 lb 9.6 oz (96.4 kg)  07/08/22 208 lb 6.4 oz (94.5 kg)  06/29/22 207 lb 10.8 oz (94.2 kg)      Physical Exam Vitals and nursing note reviewed.  Constitutional:      General: He is not in acute distress.     Appearance: Normal appearance. He is well-developed and well-groomed. He is not ill-appearing, toxic-appearing or diaphoretic.  HENT:     Head: Normocephalic and atraumatic.     Jaw: There is normal jaw occlusion.     Right Ear: Hearing, tympanic membrane, ear canal and external ear normal.     Left Ear: Hearing, tympanic membrane, ear canal and external ear normal.     Nose: Nose normal.     Mouth/Throat:     Lips: Pink.     Mouth: Mucous membranes are moist.     Pharynx: Oropharynx is clear. Uvula midline.  Eyes:     General: Lids are normal.     Extraocular Movements: Extraocular movements intact.     Conjunctiva/sclera: Conjunctivae normal.     Pupils: Pupils are equal, round, and reactive to light.  Neck:     Thyroid: No thyroid mass, thyromegaly or thyroid tenderness.     Vascular: No carotid bruit or JVD.     Trachea: Trachea and phonation normal.  Cardiovascular:     Rate and Rhythm: Normal rate and regular rhythm.     Chest Wall: PMI is not displaced.     Pulses: Normal pulses.     Heart sounds: Normal heart sounds. No murmur heard.    No friction rub. No gallop.  Pulmonary:     Effort: Pulmonary effort is normal. No respiratory distress.     Breath sounds: Normal breath sounds. No wheezing.  Abdominal:     General: Bowel sounds are normal. There is no distension or abdominal bruit.     Palpations: Abdomen is soft. There is no hepatomegaly or splenomegaly.     Tenderness: There is no abdominal tenderness. There is no right CVA tenderness or left CVA tenderness.     Hernia: No hernia is present.  Musculoskeletal:        General: Normal range of motion.     Cervical back: Normal range of motion and neck supple.     Right lower leg: No edema.     Left lower leg: No edema.  Lymphadenopathy:     Cervical: No cervical adenopathy.  Skin:    General: Skin is warm and dry.     Capillary Refill: Capillary refill takes less than 2 seconds.     Coloration: Skin is not  cyanotic, jaundiced or pale.     Findings: No rash.  Neurological:  General: No focal deficit present.     Mental Status: He is alert and oriented to person, place, and time.     Sensory: Sensation is intact.     Motor: Motor function is intact.     Coordination: Coordination is intact.     Gait: Gait is intact.     Deep Tendon Reflexes: Reflexes are normal and symmetric.  Psychiatric:        Attention and Perception: Attention and perception normal.        Mood and Affect: Mood and affect normal.        Speech: Speech normal.        Behavior: Behavior normal. Behavior is cooperative.        Thought Content: Thought content normal.        Cognition and Memory: Cognition and memory normal.        Judgment: Judgment normal.       Last CBC Lab Results  Component Value Date   WBC 7.1 07/08/2022   HGB 11.6 (L) 07/08/2022   HCT 34.1 (L) 07/08/2022   MCV 85 07/08/2022   MCH 28.9 07/08/2022   RDW 13.0 07/08/2022   PLT 366 07/08/2022   Last metabolic panel Lab Results  Component Value Date   GLUCOSE 83 07/08/2022   NA 138 07/08/2022   K 4.3 07/08/2022   CL 99 07/08/2022   CO2 26 07/08/2022   BUN 9 07/08/2022   CREATININE 1.20 07/08/2022   EGFR 78 07/08/2022   CALCIUM 9.5 07/08/2022   PHOS 3.8 06/30/2022   PROT 7.2 07/08/2022   ALBUMIN 4.7 07/08/2022   LABGLOB 2.5 07/08/2022   AGRATIO 1.9 07/08/2022   BILITOT 0.8 07/08/2022   ALKPHOS 99 07/08/2022   AST 12 07/08/2022   ALT 16 07/08/2022   ANIONGAP 6 06/30/2022   Last thyroid functions Lab Results  Component Value Date   TSH 1.330 05/29/2020        Assessment & Plan:    Routine Health Maintenance and Physical Exam  Immunization History  Administered Date(s) Administered   Tdap 10/11/2019    Health Maintenance  Topic Date Due   COVID-19 Vaccine (1) 02/11/2023 (Originally 07/13/1981)   Hepatitis C Screening  01/26/2024 (Originally 01/11/2000)   INFLUENZA VACCINE  04/07/2023   DTaP/Tdap/Td (2 - Td or  Tdap) 10/10/2029   HIV Screening  Completed   HPV VACCINES  Aged Out    Discussed health benefits of physical activity, and encouraged him to engage in regular exercise appropriate for his age and condition.  Problem List Items Addressed This Visit   None Visit Diagnoses     Annual physical exam    -  Primary   Relevant Orders   CBC with Differential/Platelet   CMP14+EGFR   Lipid panel   Thyroid Panel With TSH   Hepatitis C antibody      Return in about 1 year (around 01/26/2024) for CPE.     Kari Baars, FNP

## 2023-01-27 LAB — CBC WITH DIFFERENTIAL/PLATELET
Basophils Absolute: 0 10*3/uL (ref 0.0–0.2)
Basos: 0 %
EOS (ABSOLUTE): 0.2 10*3/uL (ref 0.0–0.4)
Eos: 2 %
Hematocrit: 42.4 % (ref 37.5–51.0)
Hemoglobin: 14.2 g/dL (ref 13.0–17.7)
Immature Grans (Abs): 0 10*3/uL (ref 0.0–0.1)
Immature Granulocytes: 0 %
Lymphocytes Absolute: 2.4 10*3/uL (ref 0.7–3.1)
Lymphs: 33 %
MCH: 28.1 pg (ref 26.6–33.0)
MCHC: 33.5 g/dL (ref 31.5–35.7)
MCV: 84 fL (ref 79–97)
Monocytes Absolute: 0.5 10*3/uL (ref 0.1–0.9)
Monocytes: 7 %
Neutrophils Absolute: 4.3 10*3/uL (ref 1.4–7.0)
Neutrophils: 58 %
Platelets: 280 10*3/uL (ref 150–450)
RBC: 5.05 x10E6/uL (ref 4.14–5.80)
RDW: 13.8 % (ref 11.6–15.4)
WBC: 7.5 10*3/uL (ref 3.4–10.8)

## 2023-01-27 LAB — CMP14+EGFR
ALT: 21 IU/L (ref 0–44)
AST: 16 IU/L (ref 0–40)
Albumin/Globulin Ratio: 1.7 (ref 1.2–2.2)
Albumin: 4.7 g/dL (ref 4.1–5.1)
Alkaline Phosphatase: 100 IU/L (ref 44–121)
BUN/Creatinine Ratio: 11 (ref 9–20)
BUN: 13 mg/dL (ref 6–24)
Bilirubin Total: 1.1 mg/dL (ref 0.0–1.2)
CO2: 21 mmol/L (ref 20–29)
Calcium: 10 mg/dL (ref 8.7–10.2)
Chloride: 101 mmol/L (ref 96–106)
Creatinine, Ser: 1.18 mg/dL (ref 0.76–1.27)
Globulin, Total: 2.7 g/dL (ref 1.5–4.5)
Glucose: 78 mg/dL (ref 70–99)
Potassium: 4.5 mmol/L (ref 3.5–5.2)
Sodium: 138 mmol/L (ref 134–144)
Total Protein: 7.4 g/dL (ref 6.0–8.5)
eGFR: 80 mL/min/{1.73_m2} (ref >59)

## 2023-01-27 LAB — LIPID PANEL
Chol/HDL Ratio: 5 ratio (ref 0.0–5.0)
Cholesterol, Total: 209 mg/dL — ABNORMAL HIGH (ref 100–199)
HDL: 42 mg/dL (ref >39)
LDL Chol Calc (NIH): 145 mg/dL — ABNORMAL HIGH (ref 0–99)
Triglycerides: 124 mg/dL (ref 0–149)
VLDL Cholesterol Cal: 22 mg/dL (ref 5–40)

## 2023-01-27 LAB — THYROID PANEL WITH TSH
Free Thyroxine Index: 2.2 (ref 1.2–4.9)
T3 Uptake Ratio: 27 % (ref 24–39)
T4, Total: 8 ug/dL (ref 4.5–12.0)
TSH: 1.52 u[IU]/mL (ref 0.450–4.500)

## 2023-01-27 LAB — HEPATITIS C ANTIBODY: Hep C Virus Ab: NONREACTIVE

## 2023-09-09 ENCOUNTER — Ambulatory Visit: Payer: Self-pay | Admitting: Family Medicine

## 2023-09-09 ENCOUNTER — Telehealth: Payer: 59 | Admitting: Family Medicine

## 2023-09-09 DIAGNOSIS — J111 Influenza due to unidentified influenza virus with other respiratory manifestations: Secondary | ICD-10-CM

## 2023-09-09 MED ORDER — PROMETHAZINE-DM 6.25-15 MG/5ML PO SYRP: 5.0000 mL | ORAL_SOLUTION | Freq: Four times a day (QID) | ORAL | 0 refills | Status: AC | PRN

## 2023-09-09 MED ORDER — OSELTAMIVIR PHOSPHATE 75 MG PO CAPS: 75.0000 mg | ORAL_CAPSULE | Freq: Two times a day (BID) | ORAL | 0 refills | Status: AC

## 2023-09-09 NOTE — Progress Notes (Signed)
 Virtual Visit Consent   [name], you are scheduled for a virtual visit with a [facility] provider today. Just as with appointments in the office, your consent must be obtained to participate. Your consent will be active for this visit and any virtual visit you may have with one of our providers in the next 365 days. If you have a MyChart account, a copy of this consent can be sent to you electronically.  As this is a virtual visit, video technology does not allow for your provider to perform a traditional examination. This may limit your provider's ability to fully assess your condition. If your provider identifies any concerns that need to be evaluated in person or the need to arrange testing (such as labs, EKG, etc.), we will make arrangements to do so. Although advances in technology are sophisticated, we cannot ensure that it will always work on either your end or our end. If the connection with a video visit is poor, the visit may have to be switched to a telephone visit. With either a video or telephone visit, we are not always able to ensure that we have a secure connection.  By engaging in this virtual visit, you consent to the provision of healthcare and authorize for your insurance to be billed (if applicable) for the services provided during this visit. Depending on your insurance coverage, you may receive a charge related to this service.  I need to obtain your verbal consent now. Are you willing to proceed with your visit today? Jerome Young has provided verbal consent on 09/09/2023 for a virtual visit (video or telephone). Loa Lamp, FNP  Date: 09/09/2023 12:13 PM  Virtual Visit via Video Note   I, Loa Lamp, connected with  Jerome Young  ([id number], Feb 22, 1981) on 09/09/23 at 12:15 PM EST by a video-enabled telemedicine application and verified that I am speaking with the correct person using two identifiers.  Location: Patient: Virtual Visit Location Patient:  Home Provider: Virtual Visit Location Provider: Home Office   I discussed the limitations of evaluation and management by telemedicine and the availability of in person appointments. The patient expressed understanding and agreed to proceed.    History of Present Illness: Jerome Young is a 43 y.o. who identifies as a male who was assigned male at birth, and is being seen today for body aches, fever, chills, cough, headache, nausea and diarrhea starting yesterday. SABRA  HPI: HPI  Problems: There are no active problems to display for this patient.   Allergies:  Allergies  Allergen Reactions   Naproxen  Palpitations   Penicillins Other (See Comments)    Mother states he is allergic   Latex Rash   Medications: No current outpatient medications on file.  Observations/Objective: Patient is well-developed, well-nourished in no acute distress.  Resting comfortably  at home.  Head is normocephalic, atraumatic.  No labored breathing.  Speech is clear and coherent with logical content.  Patient is alert and oriented at baseline.    Assessment and Plan: 1. Influenza-like illness (Primary)  Increase fluids. Humidifier at night, tylenol  or ibuprofen  as directed, UC if sx worsen.   Follow Up Instructions: I discussed the assessment and treatment plan with the patient. The patient was provided an opportunity to ask questions and all were answered. The patient agreed with the plan and demonstrated an understanding of the instructions.  A copy of instructions were sent to the patient via MyChart unless otherwise noted below.     The patient was advised to  call back or seek an in-person evaluation if the symptoms worsen or if the condition fails to improve as anticipated.    Kasen Sako, FNP

## 2023-09-09 NOTE — Telephone Encounter (Signed)
  Chief Complaint: fever Symptoms: fever, diarrhea, abdominal pain, severe chills, nausea Frequency: since yesterday Pertinent Negatives: Patient denies shortness of breath Disposition: [] ED /[] Urgent Care (no appt availability in office) / [] Appointment(In office/virtual)/ [x]  [facility]/ [] Home Care/ [] Refused Recommended Disposition /[] [facility]/ []  Follow-up with PCP Additional Notes: Patient and wife called in reporting that patient has been running a 101F fever since last night that is not breaking with Tylenol . Wife reports patient is also c/o diarrhea, abdominal pain, severe chills, and nausea. Patient reports he is unable to keep anything down. Per protocol, this RN attempted to schedule in office visit. No availability. This RN attempted to schedule in person UC visit but wife is concerned that patient will not be able to make the drive. Virtual UC visit scheduled for today 1/3. Patient and wife advised to call back with worsening symptoms. Patient and wife verbalized understanding.     Copied from CRM [id number]. Topic: Clinical - Red Word Triage >> Sep 09, 2023  8:30 AM Ivette P wrote: [facility] that prompted transfer to Nurse Triage: Fever, of 101 diarrhea, upset stomach. Reason for Disposition  Severe chills (i.e., feeling extremely cold WITH shaking chills)  Answer Assessment - Initial Assessment Questions 1. TEMPERATURE: What is the most recent temperature?  How was it measured?      101F 2. ONSET: When did the fever start?      yesterday 3. CHILLS: Do you have chills? If yes: How bad are they?  (e.g., none, mild, moderate, severe)   - NONE: no chills   - MILD: feeling cold   - MODERATE: feeling very cold, some shivering (feels better under a thick blanket)   - SEVERE: feeling extremely cold with shaking chills (general body shaking, rigors; even under a thick blanket)      severe 4. OTHER SYMPTOMS: Do you have any other symptoms besides the  fever?  (e.g., abdomen pain, cough, diarrhea, earache, headache, sore throat, urination pain)     Diarrhea, abdominal pain, nausea 5. CAUSE: If there are no symptoms, ask: What do you think is causing the fever?      I think he has a stomach bug 6. CONTACTS: Does anyone else in the family have an infection?     Co-workers 7. TREATMENT: What have you done so far to treat this fever? (e.g., medications)     tylenol  8. IMMUNOCOMPROMISE: Do you have of the following: diabetes, HIV positive, splenectomy, cancer chemotherapy, chronic steroid treatment, transplant patient, etc.     none  Protocols used: Fever-A-AH

## 2023-09-09 NOTE — Patient Instructions (Signed)

## 2024-01-11 ENCOUNTER — Telehealth: Payer: Self-pay

## 2024-01-11 NOTE — Telephone Encounter (Signed)
 Copied from CRM [id number]. Topic: Appointments - Appointment Cancel/Reschedule >> Jan 11, 2024 12:31 PM Jerome Young H wrote: Patient called and he wants to either be seen or speak to someone, there has been a lot of deaths in his family and patient states his anxiety is through the roof and he needs to knows if he can either be seen sooner, be referred to talk to someone or get something to take for it, patients callback number is [phone].

## 2024-01-11 NOTE — Telephone Encounter (Signed)
 Apt scheduled.

## 2024-01-12 ENCOUNTER — Encounter: Payer: Self-pay | Admitting: Family Medicine

## 2024-01-12 ENCOUNTER — Ambulatory Visit (INDEPENDENT_AMBULATORY_CARE_PROVIDER_SITE_OTHER): Admitting: Family Medicine

## 2024-01-12 VITALS — BP 140/89 | HR 108 | Temp 98.0°F | Ht 71.0 in | Wt 206.8 lb

## 2024-01-12 DIAGNOSIS — J301 Allergic rhinitis due to pollen: Secondary | ICD-10-CM

## 2024-01-12 DIAGNOSIS — R079 Chest pain, unspecified: Secondary | ICD-10-CM | POA: Diagnosis not present

## 2024-01-12 DIAGNOSIS — F418 Other specified anxiety disorders: Secondary | ICD-10-CM | POA: Insufficient documentation

## 2024-01-12 DIAGNOSIS — K219 Gastro-esophageal reflux disease without esophagitis: Secondary | ICD-10-CM | POA: Diagnosis not present

## 2024-01-12 DIAGNOSIS — F410 Panic disorder [episodic paroxysmal anxiety] without agoraphobia: Secondary | ICD-10-CM

## 2024-01-12 MED ORDER — HYDROXYZINE HCL 10 MG PO TABS: 10.0000 mg | ORAL_TABLET | Freq: Three times a day (TID) | ORAL | 0 refills | Status: DC | PRN

## 2024-01-12 MED ORDER — FLUTICASONE PROPIONATE 50 MCG/ACT NA SUSP: 2.0000 | Freq: Every day | NASAL | 6 refills | Status: AC

## 2024-01-12 MED ORDER — PANTOPRAZOLE SODIUM 40 MG PO TBEC: 40.0000 mg | DELAYED_RELEASE_TABLET | Freq: Every day | ORAL | 3 refills | Status: AC

## 2024-01-12 MED ORDER — SERTRALINE HCL 50 MG PO TABS: 50.0000 mg | ORAL_TABLET | Freq: Every day | ORAL | 1 refills | Status: DC

## 2024-01-12 NOTE — Progress Notes (Signed)
 Subjective:  Patient ID: Jerome Young, male    DOB: 1980/12/30, 43 y.o.   MRN: [id number]  Patient Care Team: Galvin Jules, FNP as PCP - General (Family Medicine)   Chief Complaint:  Anxiety (X 1 1/2 months since he has had traumatic deaths in family. )   HPI: Jerome Young is a 43 y.o. male presenting on 01/12/2024 for Anxiety (X 1 1/2 months since he has had traumatic deaths in family. )   Jerome Young is a 43 year old male who presents with anxiety and panic attacks.  He has been experiencing significant anxiety and panic attacks following a series of family deaths and stressful events, including his uncle's death, his partner's father's death from a heart attack, his daughter's pre-term labor, and additional family deaths, all occurring within a month and a half. He feels overwhelmed and unable to process these events, leading to increased anxiety.  His anxiety symptoms include a burning sensation in his stomach, tingling in his arm, and muscle spasms. These symptoms often cause him to fear he is having a heart attack. He also reports gastrointestinal symptoms, including yellowish stools and poor appetite, leading to reliance on protein shakes and minimal food intake.  He experienced his first major panic attack while waiting for his daughter to give birth. Since then, he has tried various methods to manage his symptoms, including taking a quarter of a Xanax, Protonix  for reflux, Tums, and engaging in deep breathing exercises. He finds some relief in reading the Bible and talking with friends and family.  He has a history of sinus issues exacerbated by pollen, leading to postnasal drip and increased anxiety. He has not been using any specific treatments for his allergies. He is very sensitive to medications, noting that adult Benadryl causes prolonged drowsiness, so he uses children's chewable Benadryl instead.  He has a family history of anxiety, as his daughter also experiences  anxiety. He works outside regularly. No chest pressure or back pain.        01/12/2024   11:01 AM 01/26/2023    2:46 PM 07/08/2022    3:49 PM 02/09/2022    9:05 AM 01/28/2021    1:56 PM  Depression screen PHQ 2/9  Decreased Interest 1 0 0 0 0  Down, Depressed, Hopeless 2 0 0 0 0  PHQ - 2 Score 3 0 0 0 0  Altered sleeping 2 0 0    Tired, decreased energy 3 0 0    Change in appetite 3 0 0    Feeling bad or failure about yourself  2 0 0    Trouble concentrating 2 0 0    Moving slowly or fidgety/restless 3 0 0    Suicidal thoughts 0 0 0    PHQ-9 Score 18 0 0    Difficult doing work/chores Somewhat difficult Not difficult at all Not difficult at all        01/12/2024   11:01 AM 01/26/2023    2:47 PM 07/08/2022    3:49 PM 02/09/2022    9:05 AM  GAD 7 : Generalized Anxiety Score  Nervous, Anxious, on Edge 3 0 0 0  Control/stop worrying 3 0 0 0  Worry too much - different things 3 0 0 0  Trouble relaxing 3 0 0 0  Restless 3 0 0 0  Easily annoyed or irritable 2 0 0 0  Afraid - awful might happen 3 0 0 0  Total GAD 7 Score 20  0 0 0  Anxiety Difficulty Somewhat difficult Not difficult at all Not difficult at all          Relevant past medical, surgical, family, and social history reviewed and updated as indicated.  Allergies and medications reviewed and updated. Data reviewed: Chart in Epic.   Past Medical History:  Diagnosis Date   Migraines     Past Surgical History:  Procedure Laterality Date   BIOPSY  06/30/2022   Procedure: BIOPSY;  Surgeon: Baldo Bonds, MD;  Location: [facility];  Service: Gastroenterology;;   ESOPHAGOGASTRODUODENOSCOPY (EGD) WITH PROPOFOL  N/A 06/30/2022   Procedure: ESOPHAGOGASTRODUODENOSCOPY (EGD) WITH PROPOFOL ;  Surgeon: Baldo Bonds, MD;  Location: [facility];  Service: Gastroenterology;  Laterality: N/A;    Social History   Socioeconomic History   Marital status: Married    Spouse name: Not on file   Number of children: Not on file    Years of education: Not on file   Highest education level: Not on file  Occupational History   Not on file  Tobacco Use   Smoking status: Never   Smokeless tobacco: Never  Vaping Use   Vaping status: Never Used  Substance and Sexual Activity   Alcohol use: Not Currently    Comment: socially   Drug use: Never   Sexual activity: Yes    Birth control/protection: Surgical  Other Topics Concern   Not on file  Social History Narrative   Not on file   Social Drivers of Health   Financial Resource Strain: Not on file  Food Insecurity: Not on file  Transportation Needs: Not on file  Physical Activity: Not on file  Stress: Not on file  Social Connections: Not on file  Intimate Partner Violence: Not on file    Outpatient Encounter Medications as of 01/12/2024  Medication Sig   fluticasone (FLONASE) 50 MCG/ACT nasal spray Place 2 sprays into both nostrils daily.   hydrOXYzine (ATARAX) 10 MG tablet Take 1 tablet (10 mg total) by mouth 3 (three) times daily as needed.   pantoprazole  (PROTONIX ) 40 MG tablet Take 1 tablet (40 mg total) by mouth daily.   sertraline (ZOLOFT) 50 MG tablet Take 1 tablet (50 mg total) by mouth daily.   No facility-administered encounter medications on file as of 01/12/2024.    Allergies  Allergen Reactions   Naproxen  Palpitations   Penicillins Other (See Comments)    Mother states he is allergic   Latex Rash    Pertinent ROS per HPI, otherwise unremarkable      Objective:  BP (!) 140/89   Pulse (!) 108   Temp 98 F (36.7 C)   Ht 5\' 11"  (1.803 m)   Wt 206 lb 12.8 oz (93.8 kg)   SpO2 97%   BMI 28.84 kg/m    Wt Readings from Last 3 Encounters:  01/12/24 206 lb 12.8 oz (93.8 kg)  01/26/23 212 lb 9.6 oz (96.4 kg)  07/08/22 208 lb 6.4 oz (94.5 kg)    Physical Exam Vitals and nursing note reviewed.  Constitutional:      General: He is not in acute distress.    Appearance: He is well-developed, well-groomed and overweight. He is not  ill-appearing, toxic-appearing or diaphoretic.  HENT:     Head: Normocephalic and atraumatic.     Right Ear: A middle ear effusion is present. Tympanic membrane is not erythematous.     Left Ear: A middle ear effusion is present. Tympanic membrane is not erythematous.     Nose:  Congestion present.     Right Turbinates: Enlarged.     Left Turbinates: Enlarged.     Right Sinus: No maxillary sinus tenderness or frontal sinus tenderness.     Left Sinus: No maxillary sinus tenderness or frontal sinus tenderness.     Mouth/Throat:     Lips: Pink.     Mouth: Mucous membranes are moist.     Pharynx: Postnasal drip present. No pharyngeal swelling, oropharyngeal exudate, posterior oropharyngeal erythema or uvula swelling.  Cardiovascular:     Rate and Rhythm: Tachycardia present. Extrasystoles are present.    Heart sounds: Normal heart sounds.  Pulmonary:     Effort: Pulmonary effort is normal.     Breath sounds: Normal breath sounds.  Musculoskeletal:     Cervical back: Neck supple.     Right lower leg: No edema.     Left lower leg: No edema.  Skin:    General: Skin is warm and dry.     Capillary Refill: Capillary refill takes less than 2 seconds.  Neurological:     General: No focal deficit present.     Mental Status: He is alert and oriented to person, place, and time.  Psychiatric:        Attention and Perception: Attention and perception normal.        Mood and Affect: Mood is anxious.        Speech: Speech normal.        Behavior: Behavior is cooperative.        Thought Content: Thought content normal.        Cognition and Memory: Cognition and memory normal.        Judgment: Judgment normal.     Results for orders placed or performed in visit on 01/26/23  CBC with Differential/Platelet   Collection Time: 01/26/23  2:59 PM  Result Value Ref Range   WBC 7.5 3.4 - 10.8 x10E3/uL   RBC 5.05 4.14 - 5.80 x10E6/uL   Hemoglobin 14.2 13.0 - 17.7 g/dL   Hematocrit 81.1 91.4 - 51.0 %    MCV 84 79 - 97 fL   MCH 28.1 26.6 - 33.0 pg   MCHC 33.5 31.5 - 35.7 g/dL   RDW 78.2 95.6 - 21.3 %   Platelets 280 150 - 450 x10E3/uL   Neutrophils 58 Not Estab. %   Lymphs 33 Not Estab. %   Monocytes 7 Not Estab. %   Eos 2 Not Estab. %   Basos 0 Not Estab. %   Neutrophils Absolute 4.3 1.4 - 7.0 x10E3/uL   Lymphocytes Absolute 2.4 0.7 - 3.1 x10E3/uL   Monocytes Absolute 0.5 0.1 - 0.9 x10E3/uL   EOS (ABSOLUTE) 0.2 0.0 - 0.4 x10E3/uL   Basophils Absolute 0.0 0.0 - 0.2 x10E3/uL   Immature Granulocytes 0 Not Estab. %   Immature Grans (Abs) 0.0 0.0 - 0.1 x10E3/uL  CMP14+EGFR   Collection Time: 01/26/23  2:59 PM  Result Value Ref Range   Glucose 78 70 - 99 mg/dL   BUN 13 6 - 24 mg/dL   Creatinine, Ser 0.86 0.76 - 1.27 mg/dL   eGFR 80 >57 QI/ONG/2.95   BUN/Creatinine Ratio 11 9 - 20   Sodium 138 134 - 144 mmol/L   Potassium 4.5 3.5 - 5.2 mmol/L   Chloride 101 96 - 106 mmol/L   CO2 21 20 - 29 mmol/L   Calcium 10.0 8.7 - 10.2 mg/dL   Total Protein 7.4 6.0 - 8.5 g/dL   Albumin 4.7 4.1 -  5.1 g/dL   Globulin, Total 2.7 1.5 - 4.5 g/dL   Albumin/Globulin Ratio 1.7 1.2 - 2.2   Bilirubin Total 1.1 0.0 - 1.2 mg/dL   Alkaline Phosphatase 100 44 - 121 IU/L   AST 16 0 - 40 IU/L   ALT 21 0 - 44 IU/L  Lipid panel   Collection Time: 01/26/23  2:59 PM  Result Value Ref Range   Cholesterol, Total 209 (H) 100 - 199 mg/dL   Triglycerides 161 0 - 149 mg/dL   HDL 42 >09 mg/dL   VLDL Cholesterol Cal 22 5 - 40 mg/dL   LDL Chol Calc (NIH) 604 (H) 0 - 99 mg/dL   Chol/HDL Ratio 5.0 0.0 - 5.0 ratio  Thyroid  Panel With TSH   Collection Time: 01/26/23  2:59 PM  Result Value Ref Range   TSH 1.520 0.450 - 4.500 uIU/mL   T4, Total 8.0 4.5 - 12.0 ug/dL   T3 Uptake Ratio 27 24 - 39 %   Free Thyroxine Index 2.2 1.2 - 4.9  Hepatitis C antibody   Collection Time: 01/26/23  2:59 PM  Result Value Ref Range   Hep C Virus Ab Non Reactive Non Reactive     EKG: ST 107, multifocal PVC's, PR 142 ms, QT 362  ms. No acute ST-T changes, no significant changes other than PVCs from previous EKG. Kattie Parrot, FNP-C  Pertinent labs & imaging results that were available during my care of the patient were reviewed by me and considered in my medical decision making.  Assessment & Plan:  Abdikarim was seen today for anxiety.  Diagnoses and all orders for this visit:  Situational anxiety -     CMP14+EGFR -     Thyroid  Panel With TSH -     sertraline (ZOLOFT) 50 MG tablet; Take 1 tablet (50 mg total) by mouth daily. -     hydrOXYzine (ATARAX) 10 MG tablet; Take 1 tablet (10 mg total) by mouth 3 (three) times daily as needed. -     Anemia Profile B -     VITAMIN D 25 Hydroxy (Vit-D Deficiency, Fractures)  Panic attacks -     CMP14+EGFR -     Thyroid  Panel With TSH -     EKG 12-Lead -     sertraline (ZOLOFT) 50 MG tablet; Take 1 tablet (50 mg total) by mouth daily. -     hydrOXYzine (ATARAX) 10 MG tablet; Take 1 tablet (10 mg total) by mouth 3 (three) times daily as needed. -     Anemia Profile B -     VITAMIN D 25 Hydroxy (Vit-D Deficiency, Fractures)  Intermittent chest pain -     CMP14+EGFR -     Thyroid  Panel With TSH -     EKG 12-Lead -     Anemia Profile B  Gastroesophageal reflux disease without esophagitis -     pantoprazole  (PROTONIX ) 40 MG tablet; Take 1 tablet (40 mg total) by mouth daily. -     Anemia Profile B  Seasonal allergic rhinitis due to pollen -     fluticasone (FLONASE) 50 MCG/ACT nasal spray; Place 2 sprays into both nostrils daily.       Anxiety and panic attacks Experiencing anxiety and panic attacks following multiple family deaths and stressful events. Symptoms include gastrointestinal discomfort, tingling sensation in the arm, and muscle spasms. Situational anxiety and depression are considered. Discussed the use of medications for short-term management. Zoloft is recommended for anxiety and panic, with Atarax  for acute episodes. Discussed potential side effects  of Zoloft, including headache and upset stomach, and the need for consistency in medication use. Atarax may cause drowsiness and brain fog. Emphasized the importance of talking through issues and not ruminating. Blood work and EKG are planned to rule out other contributing factors such as thyroid  dysfunction and cardiac issues. - Prescribe Zoloft to be taken every night at bedtime, starting with half a pill for six days, then a whole pill. - Prescribe Atarax 10 mg for acute anxiety episodes, with the option to take more if needed. - Order blood work to check thyroid  function, electrolytes, potassium, and blood counts. - Perform EKG to rule out cardiac issues. - Encourage open communication and support from family and friends.  Gastroesophageal reflux disease (GERD) Experiencing gastrointestinal symptoms, including stomach discomfort and altered stool color, possibly related to anxiety and dietary habits. Previous use of Protonix  and Tums with limited relief. Recommended resuming Protonix  to manage stomach acid and improve symptoms. - Prescribe Protonix  to be taken once daily. - Advise a bland diet until symptoms improve.  Allergic rhinitis Experiencing sinus issues, postnasal drip, and congestion, likely due to pollen exposure. Symptoms contribute to increased anxiety. No signs of infection noted. - Prescribe Flonase to be used daily, with instructions on proper administration.         Total time spent with patient today was 40 minutes, this time was spent reviewing prior charts, labs, x-rays, discussing plan of care, and documenting the encounter.   Continue all other maintenance medications.  Follow up plan: Return in about 4 weeks (around 02/09/2024), or if symptoms worsen or fail to improve, for Anxiety.   Continue healthy lifestyle choices, including diet (rich in fruits, vegetables, and lean proteins, and low in salt and simple carbohydrates) and exercise (at least 30 minutes of  moderate physical activity daily).  Educational handout given for anxiety  The above assessment and management plan was discussed with the patient. The patient verbalized understanding of and has agreed to the management plan. Patient is aware to call the clinic if they develop any new symptoms or if symptoms persist or worsen. Patient is aware when to return to the clinic for a follow-up visit. Patient educated on when it is appropriate to go to the emergency department.   Kattie Parrot, FNP-C [facility] [phone]

## 2024-01-13 LAB — ANEMIA PROFILE B
Basophils Absolute: 0 10*3/uL (ref 0.0–0.2)
Basos: 0 %
EOS (ABSOLUTE): 0.1 10*3/uL (ref 0.0–0.4)
Eos: 1 %
Ferritin: 120 ng/mL (ref 30–400)
Folate: 9.3 ng/mL (ref >3.0)
Hematocrit: 45.5 % (ref 37.5–51.0)
Hemoglobin: 15.6 g/dL (ref 13.0–17.7)
Immature Grans (Abs): 0 10*3/uL (ref 0.0–0.1)
Immature Granulocytes: 0 %
Iron Saturation: 24 % (ref 15–55)
Iron: 78 ug/dL (ref 38–169)
Lymphocytes Absolute: 2.2 10*3/uL (ref 0.7–3.1)
Lymphs: 28 %
MCH: 29.8 pg (ref 26.6–33.0)
MCHC: 34.3 g/dL (ref 31.5–35.7)
MCV: 87 fL (ref 79–97)
Monocytes Absolute: 0.4 10*3/uL (ref 0.1–0.9)
Monocytes: 5 %
Neutrophils Absolute: 5 10*3/uL (ref 1.4–7.0)
Neutrophils: 66 %
Platelets: 300 10*3/uL (ref 150–450)
RBC: 5.23 x10E6/uL (ref 4.14–5.80)
RDW: 12.8 % (ref 11.6–15.4)
Retic Ct Pct: 1.2 % (ref 0.6–2.6)
Total Iron Binding Capacity: 321 ug/dL (ref 250–450)
UIBC: 243 ug/dL (ref 111–343)
Vitamin B-12: 362 pg/mL (ref 232–1245)
WBC: 7.7 10*3/uL (ref 3.4–10.8)

## 2024-01-13 LAB — CMP14+EGFR
ALT: 29 IU/L (ref 0–44)
AST: 22 IU/L (ref 0–40)
Albumin: 4.9 g/dL (ref 4.1–5.1)
Alkaline Phosphatase: 94 IU/L (ref 44–121)
BUN/Creatinine Ratio: 9 (ref 9–20)
BUN: 11 mg/dL (ref 6–24)
Bilirubin Total: 1.1 mg/dL (ref 0.0–1.2)
CO2: 18 mmol/L — ABNORMAL LOW (ref 20–29)
Calcium: 10.1 mg/dL (ref 8.7–10.2)
Chloride: 102 mmol/L (ref 96–106)
Creatinine, Ser: 1.18 mg/dL (ref 0.76–1.27)
Globulin, Total: 2.4 g/dL (ref 1.5–4.5)
Glucose: 132 mg/dL — ABNORMAL HIGH (ref 70–99)
Potassium: 4 mmol/L (ref 3.5–5.2)
Sodium: 140 mmol/L (ref 134–144)
Total Protein: 7.3 g/dL (ref 6.0–8.5)
eGFR: 79 mL/min/{1.73_m2} (ref >59)

## 2024-01-13 LAB — THYROID PANEL WITH TSH
Free Thyroxine Index: 2.3 (ref 1.2–4.9)
T3 Uptake Ratio: 27 % (ref 24–39)
T4, Total: 8.4 ug/dL (ref 4.5–12.0)
TSH: 1.15 u[IU]/mL (ref 0.450–4.500)

## 2024-01-13 LAB — VITAMIN D 25 HYDROXY (VIT D DEFICIENCY, FRACTURES): Vit D, 25-Hydroxy: 27.3 ng/mL — ABNORMAL LOW (ref 30.0–100.0)

## 2024-01-16 LAB — SPECIMEN STATUS REPORT

## 2024-01-16 LAB — HGB A1C W/O EAG: Hgb A1c MFr Bld: 5.4 % (ref 4.8–5.6)

## 2024-01-17 ENCOUNTER — Ambulatory Visit: Payer: Self-pay

## 2024-01-17 ENCOUNTER — Ambulatory Visit: Payer: Self-pay | Admitting: *Deleted

## 2024-01-17 ENCOUNTER — Telehealth: Payer: Self-pay

## 2024-01-17 NOTE — Telephone Encounter (Signed)
Patient aware and verbalizes understanding.  Appointment scheduled. 

## 2024-01-17 NOTE — Telephone Encounter (Signed)
 Stop zoloft  and follow up with PCP next week

## 2024-01-17 NOTE — Telephone Encounter (Signed)
 Copied from CRM [id number]. Topic: Clinical - Prescription Issue >> Jan 17, 2024  8:27 AM Lenon Radar A wrote: Reason for CRM: Patient called in stating that he was prescribed Zoloft  for Anxiety and the medication is making him feel worse. Patient is asking for a different medication or if he can stop taking the medication. Patient is experiencing trouble sleeping, shivering, agitation, headaches, diarrhea. Please contact

## 2024-01-17 NOTE — Telephone Encounter (Signed)
 Copied from CRM [id number]. Topic: Clinical - Red Word Triage >> Jan 17, 2024  8:30 AM Lenon Radar A wrote: Red Word that prompted transfer to Nurse Triage: Diarrhea, Elevated high bp and heart rate   Chief Complaint: Medication reaction Symptoms: increased agitation and restlesness Frequency: intermittent, worsens after each dose Pertinent Negatives: Patient denies fever or shortness of breath Disposition: [] ED /[] Urgent Care (no appt availability in office) / [] Appointment(In office/virtual)/ []  [facility]/ [] Home Care/ [] Refused Recommended Disposition /[] [facility]/ []  Follow-up with PCP Additional Notes: Spoke with wife Odilia Bennett, patient was started on Zoloft  last week (5/8) and is now having increased agitation and restlessness, elevated BP (138/87 ?) and diarrhea, clenching jaw. Per Odilia Bennett, symptoms seem to worsen with each dose patient takes and she was instructed to monitor serotonin syndrome. Call made to [facility], but nurse unavailable at this time. Will hold off on scheduling for now until further instructed by PCP or nurse. RN advising patient to hold next dose until told otherwise by PCP. Next avail with Stana Ear FNP is 5/28 however patient has appt on 5/23. Will route to clinic [facility] for follow-up andpossible scheduling.  Reason for Disposition  [1] Caller has URGENT medicine question about med that PCP or specialist prescribed AND [2] triager unable to answer question  Answer Assessment - Initial Assessment Questions 1. NAME of MEDICINE: "What medicine(s) are you calling about?"     Zoloft   2. QUESTION: "What is your question?" (e.g., double dose of medicine, side effect)     Concerned about side effects  3. PRESCRIBER: "Who prescribed the medicine?" Reason: if prescribed by specialist, call should be referred to that group.     Evalyn Hillier, FNP  4. SYMPTOMS: "Do you have any symptoms?" If Yes, ask: "What symptoms are you having?"  "How bad are the symptoms (e.g., mild,  moderate, severe)     tarted on Zoloft  last week and now having increased agitation and restlessness, elevated BP and diarrhea, clenching jaw  5. PREGNANCY:  "Is there any chance that you are pregnant?" "When was your last menstrual period?"     N/a  Protocols used: Medication Question Call-A-AH

## 2024-01-17 NOTE — Telephone Encounter (Signed)
 Duplicate message - a message has already been sent to covering provider

## 2024-01-24 ENCOUNTER — Ambulatory Visit: Admitting: Family Medicine

## 2024-01-27 ENCOUNTER — Ambulatory Visit (INDEPENDENT_AMBULATORY_CARE_PROVIDER_SITE_OTHER): Payer: 59 | Admitting: Family Medicine

## 2024-01-27 ENCOUNTER — Encounter: Payer: Self-pay | Admitting: Family Medicine

## 2024-01-27 ENCOUNTER — Other Ambulatory Visit

## 2024-01-27 VITALS — BP 126/78 | HR 104 | Temp 97.6°F | Ht 71.0 in | Wt 202.6 lb

## 2024-01-27 DIAGNOSIS — R002 Palpitations: Secondary | ICD-10-CM

## 2024-01-27 DIAGNOSIS — F410 Panic disorder [episodic paroxysmal anxiety] without agoraphobia: Secondary | ICD-10-CM

## 2024-01-27 DIAGNOSIS — Z136 Encounter for screening for cardiovascular disorders: Secondary | ICD-10-CM

## 2024-01-27 DIAGNOSIS — F418 Other specified anxiety disorders: Secondary | ICD-10-CM | POA: Diagnosis not present

## 2024-01-27 DIAGNOSIS — Z130 Encounter for screening for diseases of the blood and blood-forming organs and certain disorders involving the immune mechanism: Secondary | ICD-10-CM

## 2024-01-27 DIAGNOSIS — Z0001 Encounter for general adult medical examination with abnormal findings: Secondary | ICD-10-CM | POA: Diagnosis not present

## 2024-01-27 DIAGNOSIS — R0683 Snoring: Secondary | ICD-10-CM | POA: Diagnosis not present

## 2024-01-27 DIAGNOSIS — L409 Psoriasis, unspecified: Secondary | ICD-10-CM | POA: Insufficient documentation

## 2024-01-27 DIAGNOSIS — Z0000 Encounter for general adult medical examination without abnormal findings: Secondary | ICD-10-CM

## 2024-01-27 LAB — BAYER DCA HB A1C WAIVED: HB A1C (BAYER DCA - WAIVED): 5 % (ref 4.8–5.6)

## 2024-01-27 MED ORDER — HYDROXYZINE HCL 10 MG PO TABS
10.0000 mg | ORAL_TABLET | Freq: Three times a day (TID) | ORAL | 6 refills | Status: AC | PRN
Start: 2024-01-27 — End: ?

## 2024-01-27 NOTE — Progress Notes (Addendum)
 Complete physical exam  Patient: Jerome Young   DOB: [birth year]   43 y.o. Male  MRN: [id number]  Subjective:     Chief Complaint  Patient presents with   Annual Exam    Jerome Young is a 43 y.o. male who presents today for a complete physical exam. He reports consuming a general diet. The patient has a physically strenuous job, but has no regular exercise apart from work.  He generally feels well. He reports sleeping poorly. He does have additional problems to discuss today.   Jerome Young is a 43 year old male who presents for a physical exam and re-evaluation of his medications.  He discontinued his medications due to symptoms consistent with serotonin syndrome, including burning in his chest, worsening of symptoms, night terrors, shaking, and blurred vision. He occasionally uses half a Benadryl, which helps with his symptoms.  His anxiety is significantly reduced during the day but persists at night and early morning, characterized by overthinking and hyper-focusing on various issues. He wakes up in the middle of the night, with recent instances at 3 AM and 6 AM. He has not tried taking hydroxyzine  at night but had a negative experience when taking it with Zoloft , resulting in a particularly bad day. He is currently on a low dose of hydroxyzine  (10 mg). His anxiety has been improving, particularly with the support of his wife and involvement with his new baby, which has helped alleviate some of his worries.  He experiences headaches, with the most recent being the third one this month, typically occurring after activities like mowing. The headaches are located behind his eyeballs and run down his neck, usually resolving with sinus medication and hydration.  He has a bump on the back of his arm, present for months without drainage or redness. It is hard and raised, resembling a mole.  He has a history of psoriasis, which has worsened over the past few years, particularly with a  receding hairline. The psoriasis is primarily on his scalp and behind his ears. He has tried tea tree oil and specific shampoos without significant improvement.  He has a history of low vitamin D  levels and takes vitamin D  supplements every morning after eating. A recent blood test showed elevated glucose levels, attributed to recent dietary intake, including birthday cake and ice cream.  He has a family history of diabetes and is aware of the potential risk factors. No history of prostate or colorectal cancer in the family.  His wife has noticed irregularities in his heartbeat, particularly when he is asleep, and he has a history of sleep apnea. This happens on a daily basis and has been ongoing for several months.       Most recent fall risk assessment:    01/27/2024    9:00 AM  Fall Risk   Falls in the past year? 0  Number falls in past yr: 0  Injury with Fall? 0  Risk for fall due to : No Fall Risks  Follow up Falls evaluation completed     Most recent depression screenings:    01/27/2024    9:00 AM 01/12/2024   11:01 AM 01/26/2023    2:46 PM 07/08/2022    3:49 PM 02/09/2022    9:05 AM  Depression screen PHQ 2/9  Decreased Interest 1 1 0 0 0  Down, Depressed, Hopeless 1 2 0 0 0  PHQ - 2 Score 2 3 0 0 0  Altered sleeping 2 2 0 0  Tired, decreased energy 2 3 0 0   Change in appetite 1 3 0 0   Feeling bad or failure about yourself  1 2 0 0   Trouble concentrating 1 2 0 0   Moving slowly or fidgety/restless 2 3 0 0   Suicidal thoughts 0 0 0 0   PHQ-9 Score 11 18 0 0   Difficult doing work/chores Somewhat difficult Somewhat difficult Not difficult at all Not difficult at all       01/27/2024    9:00 AM 01/12/2024   11:01 AM 01/26/2023    2:47 PM 07/08/2022    3:49 PM  GAD 7 : Generalized Anxiety Score  Nervous, Anxious, on Edge 2 3 0 0  Control/stop worrying 2 3 0 0  Worry too much - different things 2 3 0 0  Trouble relaxing 2 3 0 0  Restless 1 3 0 0  Easily annoyed or  irritable 1 2 0 0  Afraid - awful might happen 2 3 0 0  Total GAD 7 Score 12 20 0 0  Anxiety Difficulty Somewhat difficult Somewhat difficult Not difficult at all Not difficult at all      Vision:Not within last year  and Dental: No current dental problems  Patient Active Problem List   Diagnosis Date Noted   Psoriasis 01/27/2024   Gastroesophageal reflux disease without esophagitis 01/12/2024   Situational anxiety 01/12/2024   Past Medical History:  Diagnosis Date   Migraines    Past Surgical History:  Procedure Laterality Date   BIOPSY  06/30/2022   Procedure: BIOPSY;  Surgeon: Dianna Specking, MD;  Location: [facility];  Service: Gastroenterology;;   ESOPHAGOGASTRODUODENOSCOPY (EGD) WITH PROPOFOL  N/A 06/30/2022   Procedure: ESOPHAGOGASTRODUODENOSCOPY (EGD) WITH PROPOFOL ;  Surgeon: Dianna Specking, MD;  Location: [facility];  Service: Gastroenterology;  Laterality: N/A;   Social History   Tobacco Use   Smoking status: Never   Smokeless tobacco: Never  Vaping Use   Vaping status: Never Used  Substance Use Topics   Alcohol use: Not Currently    Comment: socially   Drug use: Never   Social History   Socioeconomic History   Marital status: Married    Spouse name: Not on file   Number of children: Not on file   Years of education: Not on file   Highest education level: Not on file  Occupational History   Not on file  Tobacco Use   Smoking status: Never   Smokeless tobacco: Never  Vaping Use   Vaping status: Never Used  Substance and Sexual Activity   Alcohol use: Not Currently    Comment: socially   Drug use: Never   Sexual activity: Yes    Birth control/protection: Surgical  Other Topics Concern   Not on file  Social History Narrative   Not on file   Social Drivers of Health   Financial Resource Strain: Not on file  Food Insecurity: Not on file  Transportation Needs: Not on file  Physical Activity: Not on file  Stress: Not on file  Social  Connections: Not on file  Intimate Partner Violence: Not on file   Family Status  Relation Name Status   Mother  Alive   Sister  Alive   Daughter  Alive   MGM  Deceased   MGF  Deceased  No partnership data on file   Family History  Problem Relation Age of Onset   Anxiety disorder Mother    Depression Mother  Anxiety disorder Sister    Depression Sister    Hypertension Maternal Grandmother    Hyperlipidemia Maternal Grandmother    Hypertension Maternal Grandfather    Allergies  Allergen Reactions   Naproxen  Palpitations   Penicillins Other (See Comments)    Mother states he is allergic   Latex Rash      Patient Care Team: Severa Rock HERO, FNP as PCP - General (Family Medicine)   Outpatient Medications Prior to Visit  Medication Sig   fluticasone  (FLONASE ) 50 MCG/ACT nasal spray Place 2 sprays into both nostrils daily.   pantoprazole  (PROTONIX ) 40 MG tablet Take 1 tablet (40 mg total) by mouth daily.   [DISCONTINUED] hydrOXYzine  (ATARAX ) 10 MG tablet Take 1 tablet (10 mg total) by mouth 3 (three) times daily as needed. (Patient not taking: Reported on 01/27/2024)   [DISCONTINUED] sertraline  (ZOLOFT ) 50 MG tablet Take 1 tablet (50 mg total) by mouth daily. (Patient not taking: Reported on 01/27/2024)   No facility-administered medications prior to visit.    ROS per HPI     Objective:     BP 126/78   Pulse (!) 104   Temp 97.6 F (36.4 C)   Ht 5' 11 (1.803 m)   Wt 202 lb 9.6 oz (91.9 kg)   SpO2 100%   BMI 28.26 kg/m  BP Readings from Last 3 Encounters:  01/27/24 126/78  01/12/24 (!) 140/89  01/26/23 127/83   Wt Readings from Last 3 Encounters:  01/27/24 202 lb 9.6 oz (91.9 kg)  01/12/24 206 lb 12.8 oz (93.8 kg)  01/26/23 212 lb 9.6 oz (96.4 kg)   SpO2 Readings from Last 3 Encounters:  01/27/24 100%  01/12/24 97%  01/26/23 99%      Physical Exam Vitals and nursing note reviewed.  Constitutional:      General: He is not in acute distress.     Appearance: Normal appearance. He is well-developed, well-groomed and overweight. He is not ill-appearing, toxic-appearing or diaphoretic.  HENT:     Head: Normocephalic and atraumatic.     Jaw: There is normal jaw occlusion.     Right Ear: Hearing, tympanic membrane, ear canal and external ear normal.     Left Ear: Hearing, tympanic membrane, ear canal and external ear normal.     Nose: Nose normal.     Mouth/Throat:     Lips: Pink.     Mouth: Mucous membranes are moist.     Pharynx: Oropharynx is clear. Uvula midline.  Eyes:     General: Lids are normal.     Extraocular Movements: Extraocular movements intact.     Conjunctiva/sclera: Conjunctivae normal.     Pupils: Pupils are equal, round, and reactive to light.  Neck:     Thyroid : No thyroid  mass, thyromegaly or thyroid  tenderness.     Vascular: No carotid bruit or JVD.     Trachea: Trachea and phonation normal.  Cardiovascular:     Rate and Rhythm: Normal rate and regular rhythm.     Chest Wall: PMI is not displaced.     Pulses:          Dorsalis pedis pulses are 2+ on the right side and 2+ on the left side.       Posterior tibial pulses are 2+ on the right side and 2+ on the left side.     Heart sounds: Normal heart sounds. No murmur heard.    No friction rub. No gallop.  Pulmonary:     Effort: Pulmonary  effort is normal. No respiratory distress.     Breath sounds: Normal breath sounds. No wheezing.  Chest:     Chest wall: No mass.  Breasts:    Breasts are symmetrical.  Abdominal:     General: Abdomen is flat. Bowel sounds are normal. There is no distension or abdominal bruit.     Palpations: Abdomen is soft. There is no hepatomegaly or splenomegaly.     Tenderness: There is no abdominal tenderness. There is no right CVA tenderness or left CVA tenderness.     Hernia: No hernia is present.  Musculoskeletal:        General: Normal range of motion.     Cervical back: Full passive range of motion without pain, normal range  of motion and neck supple.     Right lower leg: No edema.     Left lower leg: No edema.  Feet:     Right foot:     Skin integrity: Skin integrity normal.     Toenail Condition: Right toenails are normal.     Left foot:     Skin integrity: Skin integrity normal.     Toenail Condition: Left toenails are normal.  Lymphadenopathy:     Cervical: No cervical adenopathy.     Upper Body:     Right upper body: No supraclavicular, axillary or pectoral adenopathy.     Left upper body: No supraclavicular, axillary or pectoral adenopathy.  Skin:    General: Skin is warm and dry.     Capillary Refill: Capillary refill takes less than 2 seconds.     Coloration: Skin is not cyanotic, jaundiced or pale.     Findings: Rash present. Rash is scaling (scalp).       Neurological:     General: No focal deficit present.     Mental Status: He is alert and oriented to person, place, and time.     Sensory: Sensation is intact.     Motor: Motor function is intact.     Coordination: Coordination is intact.     Gait: Gait is intact.     Deep Tendon Reflexes: Reflexes are normal and symmetric.  Psychiatric:        Attention and Perception: Attention and perception normal.        Mood and Affect: Mood and affect normal.        Speech: Speech normal.        Behavior: Behavior normal. Behavior is cooperative.        Thought Content: Thought content normal.        Cognition and Memory: Cognition and memory normal.        Judgment: Judgment normal.      Last CBC Lab Results  Component Value Date   WBC 7.7 01/12/2024   HGB 15.6 01/12/2024   HCT 45.5 01/12/2024   MCV 87 01/12/2024   MCH 29.8 01/12/2024   RDW 12.8 01/12/2024   PLT 300 01/12/2024   Last metabolic panel Lab Results  Component Value Date   GLUCOSE 132 (H) 01/12/2024   NA 140 01/12/2024   K 4.0 01/12/2024   CL 102 01/12/2024   CO2 18 (L) 01/12/2024   BUN 11 01/12/2024   CREATININE 1.18 01/12/2024   EGFR 79 01/12/2024   CALCIUM  10.1 01/12/2024   PHOS 3.8 06/30/2022   PROT 7.3 01/12/2024   ALBUMIN 4.9 01/12/2024   LABGLOB 2.4 01/12/2024   AGRATIO 1.7 01/26/2023   BILITOT 1.1 01/12/2024   ALKPHOS 94  01/12/2024   AST 22 01/12/2024   ALT 29 01/12/2024   ANIONGAP 6 06/30/2022   Last lipids Lab Results  Component Value Date   CHOL 209 (H) 01/26/2023   HDL 42 01/26/2023   LDLCALC 145 (H) 01/26/2023   TRIG 124 01/26/2023   CHOLHDL 5.0 01/26/2023   Last hemoglobin A1c Lab Results  Component Value Date   HGBA1C 5.4 01/12/2024   Last thyroid  functions Lab Results  Component Value Date   TSH 1.150 01/12/2024   T4TOTAL 8.4 01/12/2024   Last vitamin D  Lab Results  Component Value Date   VD25OH 27.3 (L) 01/12/2024   Last vitamin B12 and Folate Lab Results  Component Value Date   VITAMINB12 362 01/12/2024   FOLATE 9.3 01/12/2024        Assessment & Plan:    Routine Health Maintenance and Physical Exam  Immunization History  Administered Date(s) Administered   Tdap 10/11/2019    Health Maintenance  Topic Date Due   COVID-19 Vaccine (1 - 2024-25 season) 02/12/2024 (Originally 05/08/2023)   INFLUENZA VACCINE  04/06/2024   DTaP/Tdap/Td (2 - Td or Tdap) 10/10/2029   Hepatitis C Screening  Completed   HIV Screening  Completed   HPV VACCINES  Aged Out   Meningococcal B Vaccine  Aged Out    Discussed health benefits of physical activity, and encouraged him to engage in regular exercise appropriate for his age and condition.  Problem List Items Addressed This Visit       Musculoskeletal and Integument   Psoriasis   Relevant Orders   Ambulatory referral to Dermatology     Other   Situational anxiety   Relevant Medications   hydrOXYzine  (ATARAX ) 10 MG tablet   Other Relevant Orders   CBC with Differential/Platelet   CMP14+EGFR   Thyroid  Panel With TSH   Other Visit Diagnoses       Annual physical exam    -  Primary   Relevant Orders   CBC with Differential/Platelet   CMP14+EGFR      Encounter for screening for cardiovascular disorders       Relevant Orders   CBC with Differential/Platelet   CMP14+EGFR   Lipid panel     Screening for endocrine, nutritional, metabolic and immunity disorder       Relevant Orders   CBC with Differential/Platelet   CMP14+EGFR   Thyroid  Panel With TSH   Bayer DCA Hb A1c Waived     Panic attacks       Relevant Medications   hydrOXYzine  (ATARAX ) 10 MG tablet   Other Relevant Orders   CBC with Differential/Platelet   CMP14+EGFR   Thyroid  Panel With TSH     Palpitations       Relevant Orders   LONG TERM MONITOR (3-14 DAYS)     Loud snoring       Relevant Orders   Ambulatory referral to Sleep Studies        Anxiety disorder Anxiety disorder with sleep disturbance, primarily at night and early morning. Symptoms include overthinking, hyper-focusing, and waking up in the middle of the night. Previous use of Zoloft  was discontinued due to adverse effects, including blurred vision and shaking. Hydroxyzine  is recommended for nighttime use to aid sleep and reduce anxiety without affecting serotonin levels. Benadryl has been effective in managing symptoms temporarily. - Recommend taking hydroxyzine  at night to aid sleep and reduce anxiety. - Continue using children's Benadryl as needed for sleep. - Report if anxiety persists after 4-6 months  or if sleep patterns do not improve.  Potential sleep apnea Potential sleep apnea indicated by irregular heart rhythm during sleep and observed apneic spells. Referral to sleep medicine for evaluation and a Zio heart monitor is planned to assess heart rate and potential sleep apnea. - Refer to sleep medicine for evaluation. - Apply Zio heart monitor for two weeks to assess heart rate and potential sleep apnea.  Psoriasis Chronic psoriasis, primarily affecting the scalp and behind the ears. Symptoms include dryness and itching, exacerbated by stress. Previous use of over-the-counter treatments and  shampoos has been ineffective. Referral to dermatology is planned for further evaluation and management. - Refer to dermatology for evaluation and management of psoriasis. - Avoid starting new topicals before dermatology evaluation.  Bump on arm for dermatology evaluation Bump on the back of the arm, present for several months. It is raised, hard, and has not drained or become red. Differential includes ingrown hair or mole. Dermatology evaluation is needed to determine if biopsy is required. - Refer to dermatology for evaluation of the bump on the arm.  Family history of diabetes Family history of diabetes noted. Previous glucose test was non-fasting and slightly elevated. A1c was normal at 5.4, indicating no diabetes. Re-evaluation of A1c is planned to confirm status. - Order A1c test to confirm glucose status.  Vitamin D  deficiency Vitamin D  deficiency previously identified. He has started taking vitamin D  supplements daily after meals. - Continue vitamin D  supplementation.       Return in about 1 year (around 01/26/2025) for Annual Physical.     Rosaline Bruns, FNP

## 2024-01-28 LAB — CMP14+EGFR
ALT: 23 IU/L (ref 0–44)
AST: 17 IU/L (ref 0–40)
Albumin: 4.4 g/dL (ref 4.1–5.1)
Alkaline Phosphatase: 90 IU/L (ref 44–121)
BUN/Creatinine Ratio: 9 (ref 9–20)
BUN: 10 mg/dL (ref 6–24)
Bilirubin Total: 0.9 mg/dL (ref 0.0–1.2)
CO2: 19 mmol/L — ABNORMAL LOW (ref 20–29)
Calcium: 9.4 mg/dL (ref 8.7–10.2)
Chloride: 104 mmol/L (ref 96–106)
Creatinine, Ser: 1.11 mg/dL (ref 0.76–1.27)
Globulin, Total: 2.6 g/dL (ref 1.5–4.5)
Glucose: 90 mg/dL (ref 70–99)
Potassium: 4.2 mmol/L (ref 3.5–5.2)
Sodium: 142 mmol/L (ref 134–144)
Total Protein: 7 g/dL (ref 6.0–8.5)
eGFR: 85 mL/min/{1.73_m2} (ref >59)

## 2024-01-28 LAB — CBC WITH DIFFERENTIAL/PLATELET
Basophils Absolute: 0 10*3/uL (ref 0.0–0.2)
Basos: 0 %
EOS (ABSOLUTE): 0.1 10*3/uL (ref 0.0–0.4)
Eos: 1 %
Hematocrit: 44.5 % (ref 37.5–51.0)
Hemoglobin: 14.7 g/dL (ref 13.0–17.7)
Immature Grans (Abs): 0 10*3/uL (ref 0.0–0.1)
Immature Granulocytes: 0 %
Lymphocytes Absolute: 2.2 10*3/uL (ref 0.7–3.1)
Lymphs: 30 %
MCH: 28.9 pg (ref 26.6–33.0)
MCHC: 33 g/dL (ref 31.5–35.7)
MCV: 87 fL (ref 79–97)
Monocytes Absolute: 0.6 10*3/uL (ref 0.1–0.9)
Monocytes: 8 %
Neutrophils Absolute: 4.4 10*3/uL (ref 1.4–7.0)
Neutrophils: 61 %
Platelets: 268 10*3/uL (ref 150–450)
RBC: 5.09 x10E6/uL (ref 4.14–5.80)
RDW: 13 % (ref 11.6–15.4)
WBC: 7.3 10*3/uL (ref 3.4–10.8)

## 2024-01-28 LAB — THYROID PANEL WITH TSH
Free Thyroxine Index: 2.5 (ref 1.2–4.9)
T3 Uptake Ratio: 30 % (ref 24–39)
T4, Total: 8.3 ug/dL (ref 4.5–12.0)
TSH: 1.51 u[IU]/mL (ref 0.450–4.500)

## 2024-01-28 LAB — LIPID PANEL
Chol/HDL Ratio: 4.2 ratio (ref 0.0–5.0)
Cholesterol, Total: 175 mg/dL (ref 100–199)
HDL: 42 mg/dL (ref >39)
LDL Chol Calc (NIH): 114 mg/dL — ABNORMAL HIGH (ref 0–99)
Triglycerides: 102 mg/dL (ref 0–149)
VLDL Cholesterol Cal: 19 mg/dL (ref 5–40)

## 2024-01-30 ENCOUNTER — Ambulatory Visit: Payer: Self-pay | Admitting: Family Medicine

## 2024-02-07 ENCOUNTER — Telehealth: Payer: Self-pay | Admitting: Family Medicine

## 2024-02-07 NOTE — Telephone Encounter (Signed)
 Can referrals update status of this referral or call patient with this info?  Copied from CRM [id number]. Topic: Referral - Status >> Feb 07, 2024  3:38 PM Zipporah Him wrote: Reason for CRM: Patient wants to know where he was referred to dermatology, information was not noted on the referral in his chart. He stated he hasn't been called for an appointment yet and was hoping for an update. Please call when available per patient.

## 2024-02-08 NOTE — Telephone Encounter (Signed)
 I have sent Referral to Dr. Quentin Brunner Office at this time as Patient requested [place] location.  Mychart Message sent to Patient with Dr. Quentin Brunner contact information.

## 2024-03-23 ENCOUNTER — Telehealth: Payer: Self-pay

## 2024-03-23 DIAGNOSIS — L282 Other prurigo: Secondary | ICD-10-CM

## 2024-03-23 MED ORDER — TRIAMCINOLONE ACETONIDE 0.1 % EX CREA: 1.0000 | TOPICAL_CREAM | Freq: Two times a day (BID) | CUTANEOUS | 0 refills | Status: DC

## 2024-03-23 NOTE — Addendum Note (Signed)
 Addended by: SEVERA ROCK HERO on: 03/23/2024 01:12 PM   Modules accepted: Orders

## 2024-03-23 NOTE — Telephone Encounter (Signed)
 Copied from CRM [id number]. Topic: Clinical - Medical Advice >> Mar 23, 2024 11:04 AM DeAngela L wrote: Reason for CRM: patient has touched poison oak or ivy and the patient would like to ask if his provider could call him in a prescription for a cream to relieve the itching  Call back Wife Rosina [phone]

## 2024-03-26 ENCOUNTER — Telehealth: Admitting: Family Medicine

## 2024-03-26 DIAGNOSIS — L237 Allergic contact dermatitis due to plants, except food: Secondary | ICD-10-CM

## 2024-03-26 MED ORDER — PREDNISONE 10 MG PO TABS: ORAL_TABLET | ORAL | 0 refills | Status: DC

## 2024-03-26 NOTE — Patient Instructions (Addendum)
 Jerome Young, thank you for joining Jerome CHRISTELLA Barefoot, NP for today's virtual visit.  While this provider is not your primary care provider (PCP), if your PCP is located in our provider database this encounter information will be shared with them immediately following your visit.   A [facility] MyChart account gives you access to today's visit and all your visits, tests, and labs performed at [facility]  click here if you don't have a Jerome Young MyChart account or go to [email or web address]  Consent: (Patient) Jerome Young provided verbal consent for this virtual visit at the beginning of the encounter.  Current Medications:  Current Outpatient Medications:    predniSONE  (DELTASONE ) 10 MG tablet, Days 1-4 take 4 tablets (40 mg) daily  Days 5-8 take 3 tablets (30 mg) daily, Days 9-11 take 2 tablets (20 mg) daily, Days 12-14 take 1 tablet (10 mg) daily., Disp: 37 tablet, Rfl: 0   fluticasone  (FLONASE ) 50 MCG/ACT nasal spray, Place 2 sprays into both nostrils daily., Disp: 16 g, Rfl: 6   hydrOXYzine  (ATARAX ) 10 MG tablet, Take 1 tablet (10 mg total) by mouth 3 (three) times daily as needed., Disp: 30 tablet, Rfl: 6   pantoprazole  (PROTONIX ) 40 MG tablet, Take 1 tablet (40 mg total) by mouth daily., Disp: 30 tablet, Rfl: 3   triamcinolone  cream (KENALOG ) 0.1 %, Apply 1 Application topically 2 (two) times daily., Disp: 30 g, Rfl: 0   Medications ordered in this encounter:  Meds ordered this encounter  Medications   predniSONE  (DELTASONE ) 10 MG tablet    Sig: Days 1-4 take 4 tablets (40 mg) daily  Days 5-8 take 3 tablets (30 mg) daily, Days 9-11 take 2 tablets (20 mg) daily, Days 12-14 take 1 tablet (10 mg) daily.    Dispense:  37 tablet    Refill:  0    Supervising Provider:   BLAISE ALEENE Young [[id number]]     *If you need refills on other medications prior to your next appointment, please contact your pharmacy*  Follow-Up: Call back or seek an in-person evaluation if  the symptoms worsen or if the condition fails to improve as anticipated.  [facility] [phone]  Other Instructions Poison Ivy Dermatitis Poison ivy dermatitis is redness and soreness of the skin caused by chemicals in the leaves of the poison ivy plant. You may have very bad itching, swelling, a rash, and blisters. What are the causes? Touching a poison ivy plant. Touching something that has the chemical on it. This may include animals or objects that have come in contact with the plant. What increases the risk? Going outdoors often in wooded or [place] areas. Going outdoors without wearing protective clothing, such as closed shoes, long pants, and a long-sleeved shirt. What are the signs or symptoms?  Skin redness. Very bad itching. A rash that often includes bumps and blisters. The rash usually appears 48 hours after exposure, if you have had it before. If this is the first time you have it, the rash may not appear until a week after exposure. Swelling. This may occur if the reaction is very bad. Symptoms usually last for 1-2 weeks. The first time you get this condition, symptoms may last 3-4 weeks. How is this treated? This condition may be treated with: Hydrocortisone cream or calamine lotion to relieve itching. Oatmeal baths to soothe the skin. Medicines, such as over-the-counter antihistamine tablets. Oral steroid medicine for very bad reactions. Follow these instructions at home: Medicines Take or apply  over-the-counter and prescription medicines only as told by your doctor. Use hydrocortisone cream or calamine lotion as needed to help with itching. General instructions Do not scratch or rub your skin. Put a cold, wet cloth (cold compress) on the affected areas or take baths in cool water. This will help with itching. Avoid hot baths and showers. Take oatmeal baths as needed. Use colloidal oatmeal. You can get this at a pharmacy or grocery store. Follow  the instructions on the package. While you have the rash, wash your clothes right after you wear them. Check the affected area every day for signs of infection. Check for: More redness, swelling, or pain. Fluid or blood. Warmth. Pus or a bad smell. Keep all follow-up visits. Your doctor may want to see how your skin is doing with treatment. How is this prevented?  Know what poison ivy looks like, so you can avoid it. This plant has three leaves with flowering branches on a single stem. The leaves are glossy. The leaves have uneven edges that come to a point. If you touch poison ivy, wash your skin with soap and water right away. Be sure to wash under your fingernails. When hiking or camping, wear long pants, a long-sleeved shirt, long socks, and hiking boots. You can also use a lotion on your skin that helps to prevent contact with poison ivy. If you think that your clothes or outdoor gear came in contact with poison ivy, rinse them off with a garden hose before you bring them inside your house. When doing yard work or gardening, wear gloves, long sleeves, long pants, and boots. Wash your garden tools and gloves if they come in contact with poison ivy. If you think that your pet has come into contact with poison ivy, wash them with pet shampoo and water. Make sure to wear gloves while washing your pet. Contact a doctor if: You have open sores in the rash area. You have any signs of infection. You have redness that spreads past the rash area. You have a fever. You have a rash over a large area of your body. You have a rash on your eyes, mouth, or genitals. Your rash does not get better after a few weeks. Get help right away if: Your face swells or your eyes swell shut. You have trouble breathing. You have trouble swallowing. These symptoms may be an emergency. Do not wait to see if the symptoms will go away. Get help right away. Call 911. This information is not intended to replace  advice given to you by your health care provider. Make sure you discuss any questions you have with your health care provider. Document Revised: 01/21/2022 Document Reviewed: 01/21/2022 Elsevier Patient Education  2024 Elsevier Inc.   If you have been instructed to have an in-person evaluation today at a local Urgent Care facility, please use the link below. It will take you to a list of all of our available [facility], including address, phone number and hours of operation. Please do not delay care.  [facility]  If you or a family member do not have a primary care provider, use the link below to schedule a visit and establish care. When you choose a [facility] primary care physician or advanced practice provider, you gain a long-term partner in health. Find a Primary Care Provider  Learn more about [facility] in-office and virtual care options: [facility] - Get Care Now

## 2024-03-26 NOTE — Progress Notes (Signed)
 Virtual Visit Consent   Jerome Young, you are scheduled for a virtual visit with a  provider today. Just as with appointments in the office, your consent must be obtained to participate. Your consent will be active for this visit and any virtual visit you may have with one of our providers in the next 365 days. If you have a MyChart account, a copy of this consent can be sent to you electronically.  As this is a virtual visit, video technology does not allow for your provider to perform a traditional examination. This may limit your provider's ability to fully assess your condition. If your provider identifies any concerns that need to be evaluated in person or the need to arrange testing (such as labs, EKG, etc.), we will make arrangements to do so. Although advances in technology are sophisticated, we cannot ensure that it will always work on either your end or our end. If the connection with a video visit is poor, the visit may have to be switched to a telephone visit. With either a video or telephone visit, we are not always able to ensure that we have a secure connection.  By engaging in this virtual visit, you consent to the provision of healthcare and authorize for your insurance to be billed (if applicable) for the services provided during this visit. Depending on your insurance coverage, you may receive a charge related to this service.  I need to obtain your verbal consent now. Are you willing to proceed with your visit today? Ferdinando Lodge has provided verbal consent on 03/26/2024 for a virtual visit (video or telephone). Jerome CHRISTELLA Barefoot, NP  Date: 03/26/2024 8:33 AM   Virtual Visit via Video Note   I, Jerome Young, connected with  Jerome Young  ([id number], 02-06-1981) on 03/26/24 at  8:45 AM EDT by a audio-enabled telemedicine application and verified that I am speaking with the correct person using two identifiers.  Location: Patient: Virtual Visit Location Patient:  Home Provider: Virtual Visit Location Provider: Home Office   I discussed the limitations of evaluation and management by telemedicine and the availability of in person appointments. The patient expressed understanding and agreed to proceed.    History of Present Illness: Jerome Young is a 43 y.o. who identifies as a male who was assigned male at birth, and is being seen today for poison ivy  A week ago was at work and got into some poison ivy- on leg had a few dots and work up and it was spreading to golf ball size and then within a day it spread to the size of a softball. Has been spreading more and more over the last week, started on right ankle and a new line that is spreading up the leg to the inner thigh. Size ranges from dime to quarter size spots. Mid calf has more area. Also has spread to forearm and wrist. Started a cream last Friday- Kenalog  that has not helped much. Denies spread to back, trunk, face, hands.   Problems:  Patient Active Problem List   Diagnosis Date Noted   Psoriasis 01/27/2024   Gastroesophageal reflux disease without esophagitis 01/12/2024   Situational anxiety 01/12/2024    Allergies:  Allergies  Allergen Reactions   Naproxen  Palpitations   Penicillins Other (See Comments)    Mother states he is allergic   Latex Rash   Medications:  Current Outpatient Medications:    fluticasone  (FLONASE ) 50 MCG/ACT nasal spray, Place 2 sprays into both nostrils daily.,  Disp: 16 g, Rfl: 6   hydrOXYzine  (ATARAX ) 10 MG tablet, Take 1 tablet (10 mg total) by mouth 3 (three) times daily as needed., Disp: 30 tablet, Rfl: 6   pantoprazole  (PROTONIX ) 40 MG tablet, Take 1 tablet (40 mg total) by mouth daily., Disp: 30 tablet, Rfl: 3   triamcinolone  cream (KENALOG ) 0.1 %, Apply 1 Application topically 2 (two) times daily., Disp: 30 g, Rfl: 0  Observations/Objective: Video was not connecting   Assessment and Plan:   1. Poison ivy dermatitis (Primary)  - predniSONE   (DELTASONE ) 10 MG tablet; Days 1-4 take 4 tablets (40 mg) daily  Days 5-8 take 3 tablets (30 mg) daily, Days 9-11 take 2 tablets (20 mg) daily, Days 12-14 take 1 tablet (10 mg) daily.  Dispense: 37 tablet; Refill: 0  General instructions Do not scratch or rub your skin. Put a cold, wet cloth (cold compress) on the affected areas or take baths in cool water. This will help with itching. Avoid hot baths and showers. Take oatmeal baths as needed. Use colloidal oatmeal. You can get this at a pharmacy or grocery store. Follow the instructions on the package. While you have the rash, wash your clothes right after you wear them. Check the affected area every day for signs of infection. Check for: More redness, swelling, or pain. Fluid or blood. Warmth. Pus or a bad smell. Keep all follow-up visits. Your doctor may want to see how your skin is doing with treatment.    Reviewed side effects, risks and benefits of medication.    Patient acknowledged agreement and understanding of the plan.   Past Medical, Surgical, Social History, Allergies, and Medications have been Reviewed.    Follow Up Instructions: I discussed the assessment and treatment plan with the patient. The patient was provided an opportunity to ask questions and all were answered. The patient agreed with the plan and demonstrated an understanding of the instructions.  A copy of instructions were sent to the patient via MyChart unless otherwise noted below.    The patient was advised to call back or seek an in-person evaluation if the symptoms worsen or if the condition fails to improve as anticipated.    Jerome CHRISTELLA Barefoot, NP

## 2024-06-06 ENCOUNTER — Encounter: Payer: Self-pay | Admitting: Family Medicine

## 2024-06-06 ENCOUNTER — Ambulatory Visit (INDEPENDENT_AMBULATORY_CARE_PROVIDER_SITE_OTHER): Admitting: Family Medicine

## 2024-06-06 VITALS — BP 144/75 | HR 106 | Temp 98.0°F | Ht 71.0 in | Wt 218.6 lb

## 2024-06-06 DIAGNOSIS — B3749 Other urogenital candidiasis: Secondary | ICD-10-CM | POA: Diagnosis not present

## 2024-06-06 MED ORDER — FLUCONAZOLE 150 MG PO TABS: 150.0000 mg | ORAL_TABLET | ORAL | 0 refills | Status: AC

## 2024-06-06 NOTE — Progress Notes (Signed)
 Subjective:  Patient ID: Jerome Young, male    DOB: 1981-08-06, 43 y.o.   MRN: [id number]  Patient Care Team: Severa Rock HERO, FNP as PCP - General (Family Medicine)   Chief Complaint:  Penis Pain (Patient states that the last 2 -3 weeks ago he has been having build up around his penis.  States it now has started itching. )   HPI: Jerome Young is a 43 y.o. male presenting on 06/06/2024 for Penis Pain (Patient states that the last 2 -3 weeks ago he has been having build up around his penis.  States it now has started itching. )   Jerome Young is a 43 year old male who presents with symptoms suggestive of a yeast infection.  He has been experiencing a white substance accumulating under his foreskin, which resolves with showering but recurs. There is no associated odor. The symptoms have been present intermittently for about a month, with noticeable itching starting this morning.  No discharge from the penis, burning, or pain. Normal urination and bowel movements. The symptoms are primarily localized around the foreskin area, particularly when he does not clean thoroughly after urination. The symptoms seem to clear up and then return, particularly if he does not dry the area well after washing.  He has been using warm water and non-fragranced soap for cleaning, specifically 'ivory sensitive skin' soap, and ensures the area is dried thoroughly after washing before replacing the foreskin.          Relevant past medical, surgical, family, and social history reviewed and updated as indicated.  Allergies and medications reviewed and updated. Data reviewed: Chart in Epic.   Past Medical History:  Diagnosis Date   Migraines     Past Surgical History:  Procedure Laterality Date   BIOPSY  06/30/2022   Procedure: BIOPSY;  Surgeon: Dianna Specking, MD;  Location: [facility];  Service: Gastroenterology;;   ESOPHAGOGASTRODUODENOSCOPY (EGD) WITH PROPOFOL  N/A 06/30/2022    Procedure: ESOPHAGOGASTRODUODENOSCOPY (EGD) WITH PROPOFOL ;  Surgeon: Dianna Specking, MD;  Location: [facility];  Service: Gastroenterology;  Laterality: N/A;    Social History   Socioeconomic History   Marital status: Married    Spouse name: Not on file   Number of children: Not on file   Years of education: Not on file   Highest education level: Not on file  Occupational History   Not on file  Tobacco Use   Smoking status: Never   Smokeless tobacco: Never  Vaping Use   Vaping status: Never Used  Substance and Sexual Activity   Alcohol use: Not Currently    Comment: socially   Drug use: Never   Sexual activity: Yes    Birth control/protection: Surgical  Other Topics Concern   Not on file  Social History Narrative   Not on file   Social Drivers of Health   Financial Resource Strain: Not on file  Food Insecurity: Not on file  Transportation Needs: Not on file  Physical Activity: Not on file  Stress: Not on file  Social Connections: Not on file  Intimate Partner Violence: Not on file    Outpatient Encounter Medications as of 06/06/2024  Medication Sig   fluconazole (DIFLUCAN) 150 MG tablet Take 1 tablet (150 mg total) by mouth once a week for 2 doses.   fluticasone  (FLONASE ) 50 MCG/ACT nasal spray Place 2 sprays into both nostrils daily.   hydrOXYzine  (ATARAX ) 10 MG tablet Take 1 tablet (10 mg total) by mouth 3 (three) times  daily as needed.   pantoprazole  (PROTONIX ) 40 MG tablet Take 1 tablet (40 mg total) by mouth daily.   [DISCONTINUED] predniSONE  (DELTASONE ) 10 MG tablet Days 1-4 take 4 tablets (40 mg) daily  Days 5-8 take 3 tablets (30 mg) daily, Days 9-11 take 2 tablets (20 mg) daily, Days 12-14 take 1 tablet (10 mg) daily.   [DISCONTINUED] triamcinolone  cream (KENALOG ) 0.1 % Apply 1 Application topically 2 (two) times daily.   No facility-administered encounter medications on file as of 06/06/2024.    Allergies  Allergen Reactions   Naproxen  Palpitations    Penicillins Other (See Comments)    Mother states he is allergic   Latex Rash    Pertinent ROS per HPI, otherwise unremarkable      Objective:  BP (!) 144/75   Pulse (!) 106   Temp 98 F (36.7 C)   Ht 5' 11 (1.803 m)   Wt 218 lb 9.6 oz (99.2 kg)   SpO2 97%   BMI 30.49 kg/m    Wt Readings from Last 3 Encounters:  06/06/24 218 lb 9.6 oz (99.2 kg)  01/27/24 202 lb 9.6 oz (91.9 kg)  01/12/24 206 lb 12.8 oz (93.8 kg)    Physical Exam Vitals and nursing note reviewed. Exam conducted with a chaperone present.  Constitutional:      General: He is not in acute distress.    Appearance: Normal appearance. He is not ill-appearing, toxic-appearing or diaphoretic.  HENT:     Head: Normocephalic and atraumatic.     Mouth/Throat:     Mouth: Mucous membranes are moist.  Eyes:     Pupils: Pupils are equal, round, and reactive to light.  Cardiovascular:     Rate and Rhythm: Normal rate and regular rhythm.  Pulmonary:     Effort: Pulmonary effort is normal.     Breath sounds: Normal breath sounds.  Genitourinary:    Pubic Area: No rash or pubic lice.      Penis: Uncircumcised. Erythema (with satellite lesions under foreskin) and discharge (white) present.      Testes: Normal. Cremasteric reflex is present.  Musculoskeletal:     Cervical back: Neck supple.  Skin:    General: Skin is warm and dry.     Capillary Refill: Capillary refill takes less than 2 seconds.  Neurological:     General: No focal deficit present.     Mental Status: He is alert and oriented to person, place, and time.  Psychiatric:        Mood and Affect: Mood normal.        Behavior: Behavior normal.        Thought Content: Thought content normal.        Judgment: Judgment normal.      Results for orders placed or performed in visit on 01/27/24  Bayer DCA Hb A1c Waived   Collection Time: 01/27/24  9:25 AM  Result Value Ref Range   HB A1C (BAYER DCA - WAIVED) 5.0 4.8 - 5.6 %  CBC with  Differential/Platelet   Collection Time: 01/27/24  9:30 AM  Result Value Ref Range   WBC 7.3 3.4 - 10.8 x10E3/uL   RBC 5.09 4.14 - 5.80 x10E6/uL   Hemoglobin 14.7 13.0 - 17.7 g/dL   Hematocrit 55.4 62.4 - 51.0 %   MCV 87 79 - 97 fL   MCH 28.9 26.6 - 33.0 pg   MCHC 33.0 31.5 - 35.7 g/dL   RDW 86.9 88.3 - 84.5 %  Platelets 268 150 - 450 x10E3/uL   Neutrophils 61 Not Estab. %   Lymphs 30 Not Estab. %   Monocytes 8 Not Estab. %   Eos 1 Not Estab. %   Basos 0 Not Estab. %   Neutrophils Absolute 4.4 1.4 - 7.0 x10E3/uL   Lymphocytes Absolute 2.2 0.7 - 3.1 x10E3/uL   Monocytes Absolute 0.6 0.1 - 0.9 x10E3/uL   EOS (ABSOLUTE) 0.1 0.0 - 0.4 x10E3/uL   Basophils Absolute 0.0 0.0 - 0.2 x10E3/uL   Immature Granulocytes 0 Not Estab. %   Immature Grans (Abs) 0.0 0.0 - 0.1 x10E3/uL  CMP14+EGFR   Collection Time: 01/27/24  9:30 AM  Result Value Ref Range   Glucose 90 70 - 99 mg/dL   BUN 10 6 - 24 mg/dL   Creatinine, Ser 8.88 0.76 - 1.27 mg/dL   eGFR 85 >40 fO/fpw/8.26   BUN/Creatinine Ratio 9 9 - 20   Sodium 142 134 - 144 mmol/L   Potassium 4.2 3.5 - 5.2 mmol/L   Chloride 104 96 - 106 mmol/L   CO2 19 (L) 20 - 29 mmol/L   Calcium 9.4 8.7 - 10.2 mg/dL   Total Protein 7.0 6.0 - 8.5 g/dL   Albumin 4.4 4.1 - 5.1 g/dL   Globulin, Total 2.6 1.5 - 4.5 g/dL   Bilirubin Total 0.9 0.0 - 1.2 mg/dL   Alkaline Phosphatase 90 44 - 121 IU/L   AST 17 0 - 40 IU/L   ALT 23 0 - 44 IU/L  Lipid panel   Collection Time: 01/27/24  9:30 AM  Result Value Ref Range   Cholesterol, Total 175 100 - 199 mg/dL   Triglycerides 897 0 - 149 mg/dL   HDL 42 >60 mg/dL   VLDL Cholesterol Cal 19 5 - 40 mg/dL   LDL Chol Calc (NIH) 885 (H) 0 - 99 mg/dL   Chol/HDL Ratio 4.2 0.0 - 5.0 ratio  Thyroid  Panel With TSH   Collection Time: 01/27/24  9:30 AM  Result Value Ref Range   TSH 1.510 0.450 - 4.500 uIU/mL   T4, Total 8.3 4.5 - 12.0 ug/dL   T3 Uptake Ratio 30 24 - 39 %   Free Thyroxine Index 2.5 1.2 - 4.9        Pertinent labs & imaging results that were available during my care of the patient were reviewed by me and considered in my medical decision making.  Assessment & Plan:  Jerome Young was seen today for penis pain.  Diagnoses and all orders for this visit:  Yeast dermatitis of penis -     fluconazole (DIFLUCAN) 150 MG tablet; Take 1 tablet (150 mg total) by mouth once a week for 2 doses.       Candidal balanitis (yeast infection of the penis) Intermittent itching and white discharge around the foreskin for approximately one month, consistent with candidal balanitis. No odor, burning, or pain. Likely exacerbated by warm, moist conditions and improper drying after urination. Classic presentation of yeast infection, no need for swabbing. - Prescribed Diflucan (fluconazole) 150 mg, take one tablet now and another in one week. - Advised to use non-fragranced, non-antibacterial soap for cleaning. - Instructed to ensure thorough drying of the area before retracting the foreskin. - Recommended wearing cotton underwear to reduce moisture retention. - Advised to contact if symptoms persist after treatment.          Continue all other maintenance medications.  Follow up plan: Return if symptoms worsen or fail to improve.  Continue healthy lifestyle choices, including diet (rich in fruits, vegetables, and lean proteins, and low in salt and simple carbohydrates) and exercise (at least 30 minutes of moderate physical activity daily).   The above assessment and management plan was discussed with the patient. The patient verbalized understanding of and has agreed to the management plan. Patient is aware to call the clinic if they develop any new symptoms or if symptoms persist or worsen. Patient is aware when to return to the clinic for a follow-up visit. Patient educated on when it is appropriate to go to the emergency department.   Rosaline Bruns, FNP-C [facility] [phone]

## 2024-06-25 ENCOUNTER — Encounter: Payer: Self-pay | Admitting: Family Medicine
